# Patient Record
Sex: Female | Born: 1950 | Race: Black or African American | Hispanic: No | Smoking: Current every day smoker
Health system: Southern US, Community
[De-identification: ages and names within clinical notes are randomized; demographics above are authoritative.]

## PROBLEM LIST (undated history)

## (undated) DIAGNOSIS — F319 Bipolar disorder, unspecified: Secondary | ICD-10-CM

## (undated) DIAGNOSIS — I10 Essential (primary) hypertension: Secondary | ICD-10-CM

## (undated) DIAGNOSIS — M797 Fibromyalgia: Secondary | ICD-10-CM

## (undated) DIAGNOSIS — R918 Other nonspecific abnormal finding of lung field: Secondary | ICD-10-CM

## (undated) DIAGNOSIS — F419 Anxiety disorder, unspecified: Secondary | ICD-10-CM

## (undated) DIAGNOSIS — F1021 Alcohol dependence, in remission: Secondary | ICD-10-CM

## (undated) DIAGNOSIS — E785 Hyperlipidemia, unspecified: Secondary | ICD-10-CM

## (undated) DIAGNOSIS — I5032 Chronic diastolic (congestive) heart failure: Secondary | ICD-10-CM

## (undated) DIAGNOSIS — I209 Angina pectoris, unspecified: Secondary | ICD-10-CM

## (undated) DIAGNOSIS — F32A Depression, unspecified: Secondary | ICD-10-CM

## (undated) DIAGNOSIS — M1990 Unspecified osteoarthritis, unspecified site: Secondary | ICD-10-CM

## (undated) DIAGNOSIS — R0602 Shortness of breath: Secondary | ICD-10-CM

## (undated) DIAGNOSIS — F329 Major depressive disorder, single episode, unspecified: Secondary | ICD-10-CM

## (undated) HISTORY — DX: Major depressive disorder, single episode, unspecified: F32.9

## (undated) HISTORY — DX: Anxiety disorder, unspecified: F41.9

## (undated) HISTORY — DX: Essential (primary) hypertension: I10

## (undated) HISTORY — DX: Depression, unspecified: F32.A

## (undated) HISTORY — DX: Chronic diastolic (congestive) heart failure: I50.32

## (undated) HISTORY — DX: Bipolar disorder, unspecified: F31.9

## (undated) HISTORY — PX: LEFT OOPHORECTOMY: SHX1961

## (undated) HISTORY — DX: Other nonspecific abnormal finding of lung field: R91.8

## (undated) HISTORY — DX: Alcohol dependence, in remission: F10.21

## (undated) HISTORY — DX: Hyperlipidemia, unspecified: E78.5

## (undated) HISTORY — DX: Fibromyalgia: M79.7

## (undated) HISTORY — DX: Unspecified osteoarthritis, unspecified site: M19.90

## (undated) HISTORY — PX: EXPLORATORY LAPAROTOMY: SUR591

## (undated) HISTORY — PX: LEG SURGERY: SHX1003

---

## 2004-05-03 ENCOUNTER — Ambulatory Visit: Payer: Self-pay | Admitting: Psychiatry

## 2004-05-03 ENCOUNTER — Inpatient Hospital Stay (HOSPITAL_COMMUNITY): Admission: RE | Admit: 2004-05-03 | Discharge: 2004-05-09 | Payer: Self-pay | Admitting: Psychiatry

## 2004-06-07 ENCOUNTER — Encounter: Admission: RE | Admit: 2004-06-07 | Discharge: 2004-06-07 | Payer: Self-pay | Admitting: Internal Medicine

## 2004-07-24 ENCOUNTER — Encounter: Admission: RE | Admit: 2004-07-24 | Discharge: 2004-07-24 | Payer: Self-pay | Admitting: Internal Medicine

## 2004-07-28 ENCOUNTER — Emergency Department (HOSPITAL_COMMUNITY): Admission: EM | Admit: 2004-07-28 | Discharge: 2004-07-28 | Payer: Self-pay | Admitting: Emergency Medicine

## 2004-08-09 ENCOUNTER — Encounter (HOSPITAL_COMMUNITY): Admission: RE | Admit: 2004-08-09 | Discharge: 2004-11-07 | Payer: Self-pay | Admitting: Cardiology

## 2004-08-30 ENCOUNTER — Ambulatory Visit: Payer: Self-pay | Admitting: Internal Medicine

## 2004-09-03 ENCOUNTER — Encounter: Admission: RE | Admit: 2004-09-03 | Discharge: 2004-09-03 | Payer: Self-pay | Admitting: Internal Medicine

## 2004-09-13 ENCOUNTER — Encounter: Admission: RE | Admit: 2004-09-13 | Discharge: 2004-09-13 | Payer: Self-pay | Admitting: Internal Medicine

## 2004-09-24 ENCOUNTER — Ambulatory Visit (HOSPITAL_BASED_OUTPATIENT_CLINIC_OR_DEPARTMENT_OTHER): Admission: RE | Admit: 2004-09-24 | Discharge: 2004-09-24 | Payer: Self-pay | Admitting: Internal Medicine

## 2004-09-30 ENCOUNTER — Ambulatory Visit: Payer: Self-pay | Admitting: Internal Medicine

## 2004-10-11 ENCOUNTER — Ambulatory Visit: Payer: Self-pay | Admitting: Internal Medicine

## 2004-12-11 ENCOUNTER — Emergency Department (HOSPITAL_COMMUNITY): Admission: EM | Admit: 2004-12-11 | Discharge: 2004-12-11 | Payer: Self-pay | Admitting: Emergency Medicine

## 2004-12-21 ENCOUNTER — Encounter: Admission: RE | Admit: 2004-12-21 | Discharge: 2004-12-21 | Payer: Self-pay | Admitting: Internal Medicine

## 2005-02-25 HISTORY — PX: CEREBRAL ANEURYSM REPAIR: SHX164

## 2005-04-04 ENCOUNTER — Emergency Department (HOSPITAL_COMMUNITY): Admission: EM | Admit: 2005-04-04 | Discharge: 2005-04-04 | Payer: Self-pay | Admitting: Emergency Medicine

## 2005-04-30 ENCOUNTER — Encounter: Admission: RE | Admit: 2005-04-30 | Discharge: 2005-04-30 | Payer: Self-pay | Admitting: Internal Medicine

## 2005-05-03 ENCOUNTER — Ambulatory Visit (HOSPITAL_COMMUNITY): Admission: RE | Admit: 2005-05-03 | Discharge: 2005-05-03 | Payer: Self-pay | Admitting: Internal Medicine

## 2005-05-03 ENCOUNTER — Encounter (INDEPENDENT_AMBULATORY_CARE_PROVIDER_SITE_OTHER): Payer: Self-pay | Admitting: Specialist

## 2005-05-05 ENCOUNTER — Ambulatory Visit (HOSPITAL_COMMUNITY): Admission: RE | Admit: 2005-05-05 | Discharge: 2005-05-05 | Payer: Self-pay | Admitting: Interventional Radiology

## 2005-05-10 ENCOUNTER — Encounter: Payer: Self-pay | Admitting: Interventional Radiology

## 2005-07-11 ENCOUNTER — Ambulatory Visit (HOSPITAL_COMMUNITY): Admission: RE | Admit: 2005-07-11 | Discharge: 2005-07-12 | Payer: Self-pay | Admitting: Interventional Radiology

## 2005-07-18 ENCOUNTER — Encounter: Payer: Self-pay | Admitting: Interventional Radiology

## 2005-09-02 ENCOUNTER — Ambulatory Visit (HOSPITAL_COMMUNITY): Admission: RE | Admit: 2005-09-02 | Discharge: 2005-09-02 | Payer: Self-pay | Admitting: Neurosurgery

## 2005-10-11 ENCOUNTER — Ambulatory Visit (HOSPITAL_COMMUNITY): Admission: RE | Admit: 2005-10-11 | Discharge: 2005-10-11 | Payer: Self-pay | Admitting: Interventional Radiology

## 2005-10-23 ENCOUNTER — Encounter: Admission: RE | Admit: 2005-10-23 | Discharge: 2005-11-18 | Payer: Self-pay | Admitting: Orthopedic Surgery

## 2005-11-19 ENCOUNTER — Encounter: Admission: RE | Admit: 2005-11-19 | Discharge: 2005-12-15 | Payer: Self-pay | Admitting: Orthopedic Surgery

## 2005-12-04 ENCOUNTER — Encounter: Admission: RE | Admit: 2005-12-04 | Discharge: 2005-12-04 | Payer: Self-pay | Admitting: Orthopedic Surgery

## 2005-12-16 ENCOUNTER — Encounter: Admission: RE | Admit: 2005-12-16 | Discharge: 2006-01-13 | Payer: Self-pay | Admitting: Orthopedic Surgery

## 2006-01-14 ENCOUNTER — Encounter (INDEPENDENT_AMBULATORY_CARE_PROVIDER_SITE_OTHER): Payer: Self-pay | Admitting: *Deleted

## 2006-01-14 ENCOUNTER — Encounter: Admission: RE | Admit: 2006-01-14 | Discharge: 2006-01-14 | Payer: Self-pay | Admitting: Internal Medicine

## 2006-06-05 ENCOUNTER — Ambulatory Visit (HOSPITAL_COMMUNITY): Admission: RE | Admit: 2006-06-05 | Discharge: 2006-06-05 | Payer: Self-pay | Admitting: Orthopedic Surgery

## 2006-06-26 ENCOUNTER — Encounter: Admission: RE | Admit: 2006-06-26 | Discharge: 2006-09-24 | Payer: Self-pay | Admitting: Orthopedic Surgery

## 2006-09-05 ENCOUNTER — Encounter: Admission: RE | Admit: 2006-09-05 | Discharge: 2006-09-05 | Payer: Self-pay | Admitting: Internal Medicine

## 2006-10-03 IMAGING — CR DG CHEST 2V
2 series · 2 of 2 positions shown · non-contrast
Comparison: none

CLINICAL DATA: Fever, cough.
 CHEST - 2 VIEWS:   
 Two views of the chest are compared to a chest x-ray of 06/07/04.  Prominent perihilar markings are again noted with peribronchial thickening consistent with bronchitis.  No pneumonia is seen.  The heart is within normal limits in size.  No bony abnormality is seen.

[w chest pa]
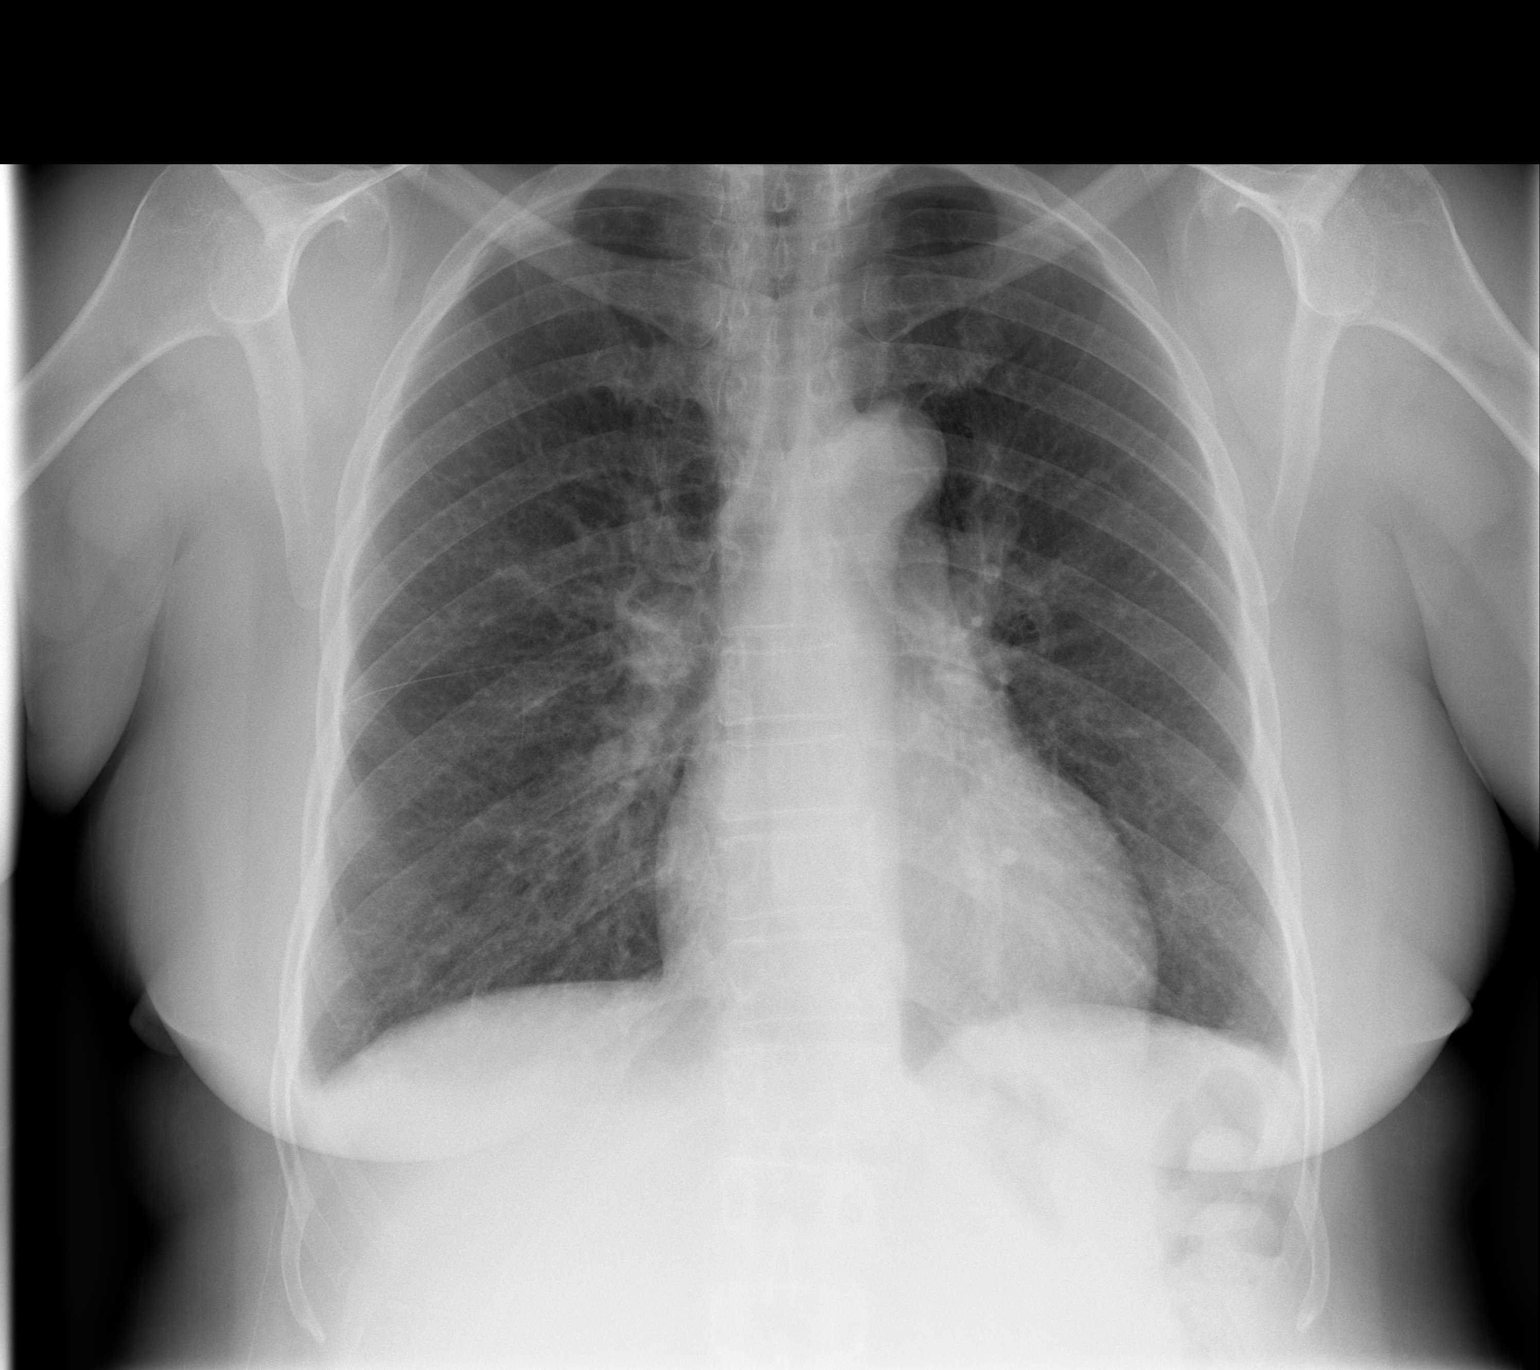

[w chest lat]
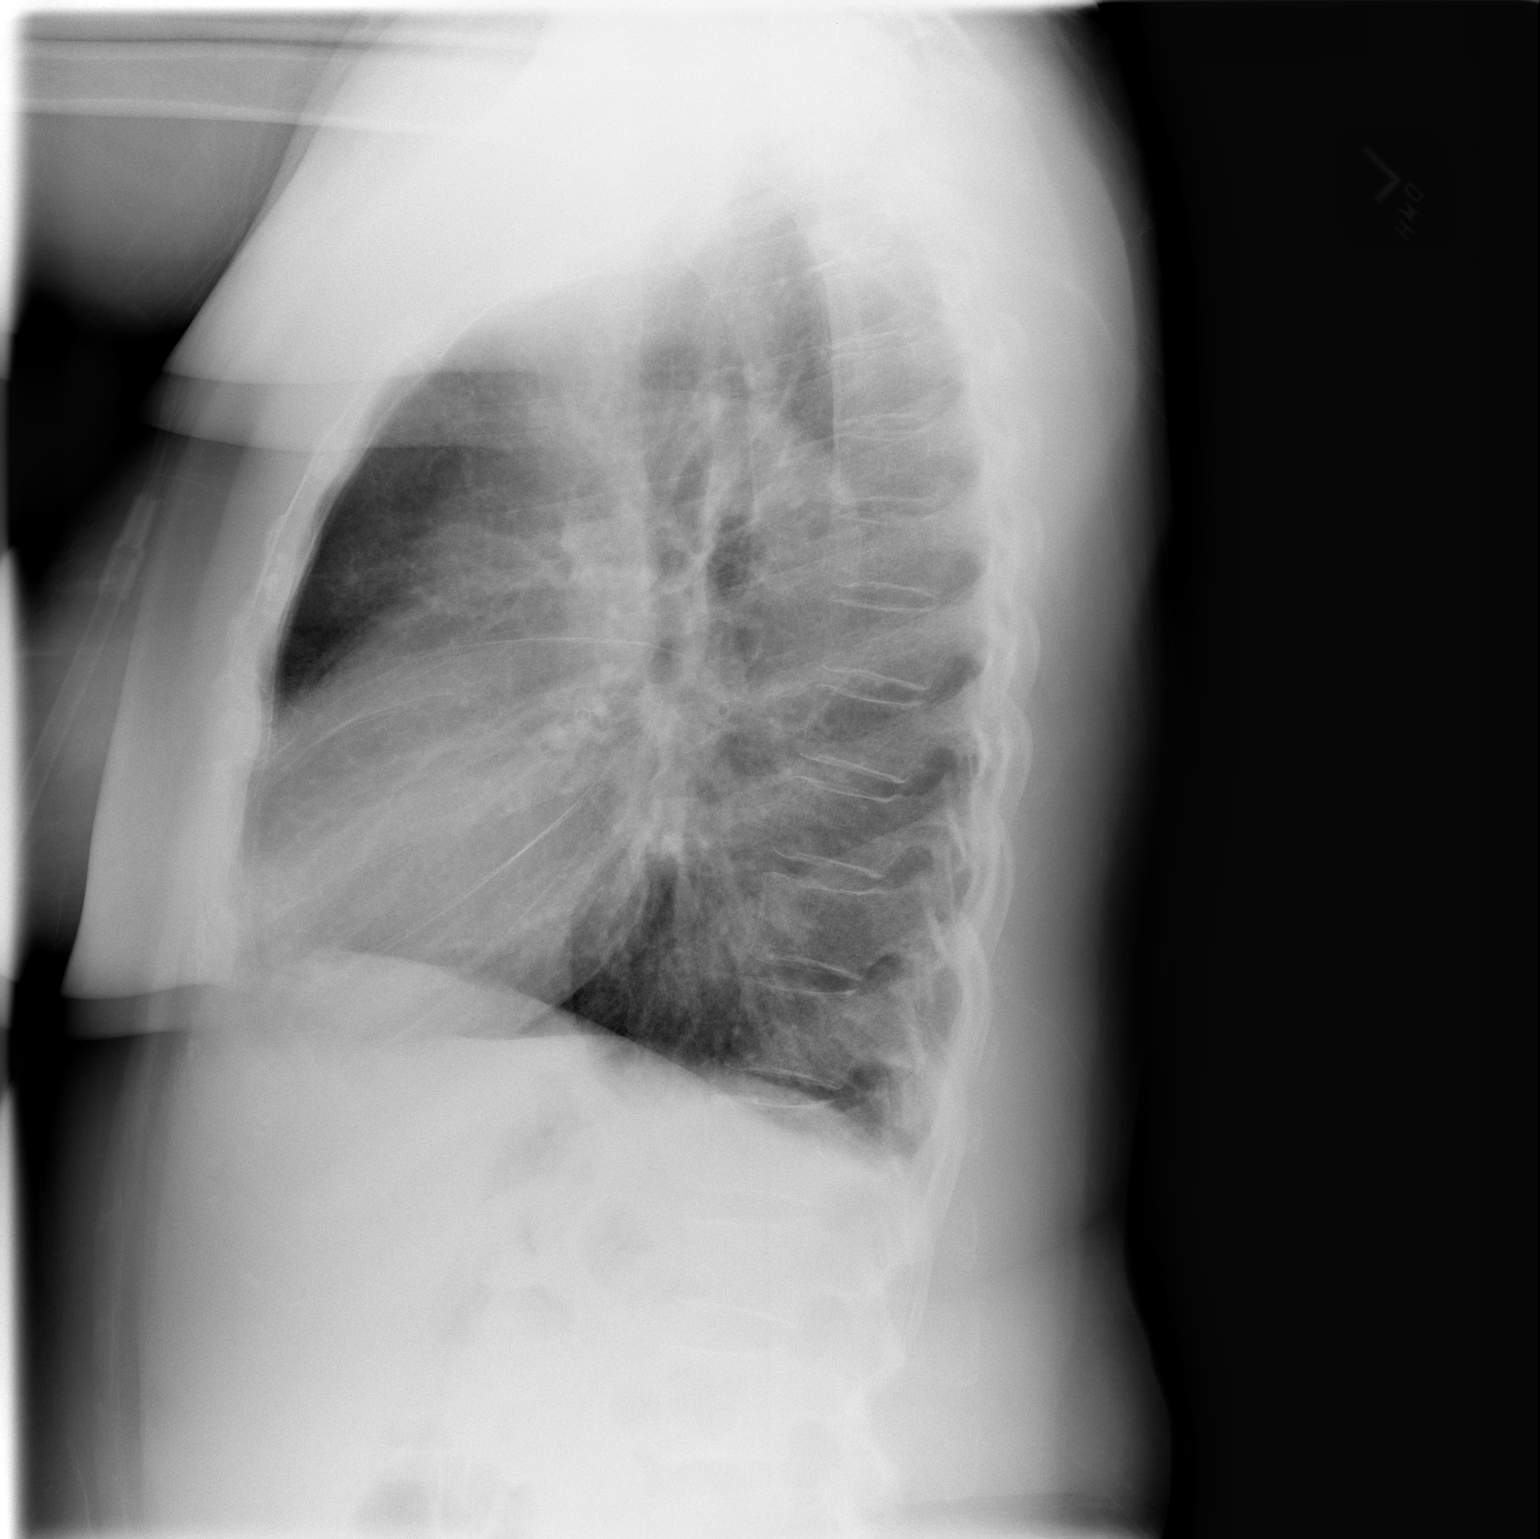

[2 of 2 positions shown; findings below may reference images not displayed]

IMPRESSION: Bronchitis.  No definite pneumonia.

## 2007-03-09 ENCOUNTER — Emergency Department (HOSPITAL_COMMUNITY): Admission: EM | Admit: 2007-03-09 | Discharge: 2007-03-09 | Payer: Self-pay | Admitting: Emergency Medicine

## 2007-03-30 ENCOUNTER — Encounter: Admission: RE | Admit: 2007-03-30 | Discharge: 2007-03-30 | Payer: Self-pay | Admitting: Internal Medicine

## 2007-07-24 ENCOUNTER — Ambulatory Visit (HOSPITAL_COMMUNITY): Admission: RE | Admit: 2007-07-24 | Discharge: 2007-07-24 | Payer: Self-pay | Admitting: Interventional Radiology

## 2007-07-28 ENCOUNTER — Encounter (INDEPENDENT_AMBULATORY_CARE_PROVIDER_SITE_OTHER): Payer: Self-pay | Admitting: Internal Medicine

## 2007-07-28 ENCOUNTER — Inpatient Hospital Stay (HOSPITAL_COMMUNITY): Admission: EM | Admit: 2007-07-28 | Discharge: 2007-07-29 | Payer: Self-pay | Admitting: Emergency Medicine

## 2008-03-28 ENCOUNTER — Ambulatory Visit: Payer: Self-pay | Admitting: Internal Medicine

## 2008-04-20 ENCOUNTER — Ambulatory Visit: Payer: Self-pay | Admitting: Internal Medicine

## 2008-07-18 ENCOUNTER — Ambulatory Visit (HOSPITAL_COMMUNITY): Admission: RE | Admit: 2008-07-18 | Discharge: 2008-07-18 | Payer: Self-pay | Admitting: Interventional Radiology

## 2008-08-10 ENCOUNTER — Encounter: Payer: Self-pay | Admitting: Interventional Radiology

## 2009-01-24 ENCOUNTER — Emergency Department (HOSPITAL_COMMUNITY): Admission: EM | Admit: 2009-01-24 | Discharge: 2009-01-24 | Payer: Self-pay | Admitting: Emergency Medicine

## 2009-04-27 ENCOUNTER — Telehealth: Payer: Self-pay | Admitting: Internal Medicine

## 2009-05-01 ENCOUNTER — Ambulatory Visit: Payer: Self-pay | Admitting: Internal Medicine

## 2009-05-01 DIAGNOSIS — I10 Essential (primary) hypertension: Secondary | ICD-10-CM | POA: Insufficient documentation

## 2009-05-01 DIAGNOSIS — F329 Major depressive disorder, single episode, unspecified: Secondary | ICD-10-CM

## 2009-05-01 DIAGNOSIS — F411 Generalized anxiety disorder: Secondary | ICD-10-CM | POA: Insufficient documentation

## 2009-05-01 DIAGNOSIS — E785 Hyperlipidemia, unspecified: Secondary | ICD-10-CM | POA: Insufficient documentation

## 2009-05-01 DIAGNOSIS — K5730 Diverticulosis of large intestine without perforation or abscess without bleeding: Secondary | ICD-10-CM | POA: Insufficient documentation

## 2009-05-01 DIAGNOSIS — R1032 Left lower quadrant pain: Secondary | ICD-10-CM | POA: Insufficient documentation

## 2009-05-01 DIAGNOSIS — R1031 Right lower quadrant pain: Secondary | ICD-10-CM

## 2009-05-02 ENCOUNTER — Encounter: Payer: Self-pay | Admitting: Internal Medicine

## 2009-05-02 ENCOUNTER — Ambulatory Visit: Payer: Self-pay | Admitting: Cardiovascular Disease

## 2009-05-03 ENCOUNTER — Ambulatory Visit: Payer: Self-pay | Admitting: Physician Assistant

## 2009-05-03 LAB — CONVERTED CEMR LAB
Bilirubin Urine: NEGATIVE
Hemoglobin, Urine: NEGATIVE
Ketones, ur: NEGATIVE mg/dL
pH: 7 (ref 5.0–8.0)

## 2009-05-10 LAB — CONVERTED CEMR LAB
BUN: 13 mg/dL (ref 6–23)
Basophils Relative: 0.2 % (ref 0.0–3.0)
Chloride: 100 meq/L (ref 96–112)
Creatinine, Ser: 1.6 mg/dL — ABNORMAL HIGH (ref 0.4–1.2)
Eosinophils Relative: 5.9 % — ABNORMAL HIGH (ref 0.0–5.0)
GFR calc non Af Amer: 42.26 mL/min (ref >60)
Hemoglobin, Urine: NEGATIVE
Lymphocytes Relative: 46.1 % — ABNORMAL HIGH (ref 12.0–46.0)
Monocytes Relative: 8.2 % (ref 3.0–12.0)
Neutrophils Relative %: 39.6 % — ABNORMAL LOW (ref 43.0–77.0)
Nitrite: NEGATIVE
Platelets: 382 10*3/uL (ref 150.0–400.0)
RBC: 3.79 M/uL — ABNORMAL LOW (ref 3.87–5.11)
Specific Gravity, Urine: 1.01 (ref 1.000–1.030)
Total Protein, Urine: 30 mg/dL
Urine Glucose: 100 mg/dL
WBC: 10.7 10*3/uL — ABNORMAL HIGH (ref 4.5–10.5)
pH: 7 (ref 5.0–8.0)

## 2009-05-19 DIAGNOSIS — J441 Chronic obstructive pulmonary disease with (acute) exacerbation: Secondary | ICD-10-CM

## 2009-05-19 DIAGNOSIS — IMO0001 Reserved for inherently not codable concepts without codable children: Secondary | ICD-10-CM | POA: Insufficient documentation

## 2009-05-19 DIAGNOSIS — M129 Arthropathy, unspecified: Secondary | ICD-10-CM | POA: Insufficient documentation

## 2009-05-24 ENCOUNTER — Encounter: Payer: Self-pay | Admitting: Internal Medicine

## 2009-05-25 ENCOUNTER — Ambulatory Visit: Payer: Self-pay | Admitting: Internal Medicine

## 2009-05-25 DIAGNOSIS — R110 Nausea: Secondary | ICD-10-CM

## 2009-06-04 IMAGING — CR DG KNEE COMPLETE 4+V*L*
4 series · 4 of 4 positions shown · non-contrast
Comparison: 03/09/2007

LEFT KNEE - 4  VIEW:

CLINICAL DATA: Knee pain

[view not recorded (1 of 4)]
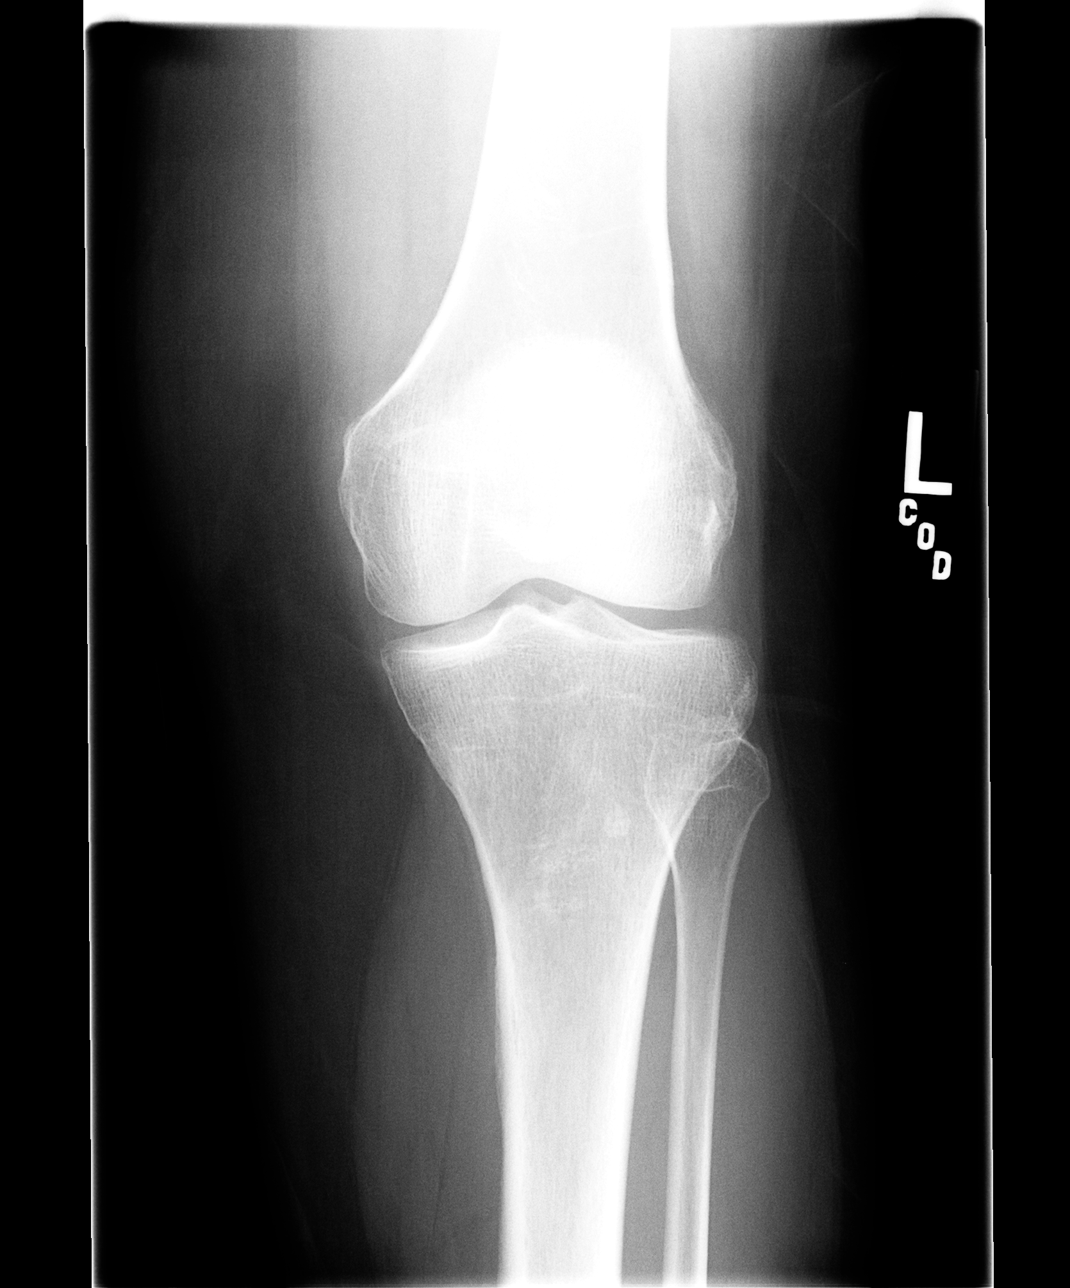

[view not recorded (2 of 4)]
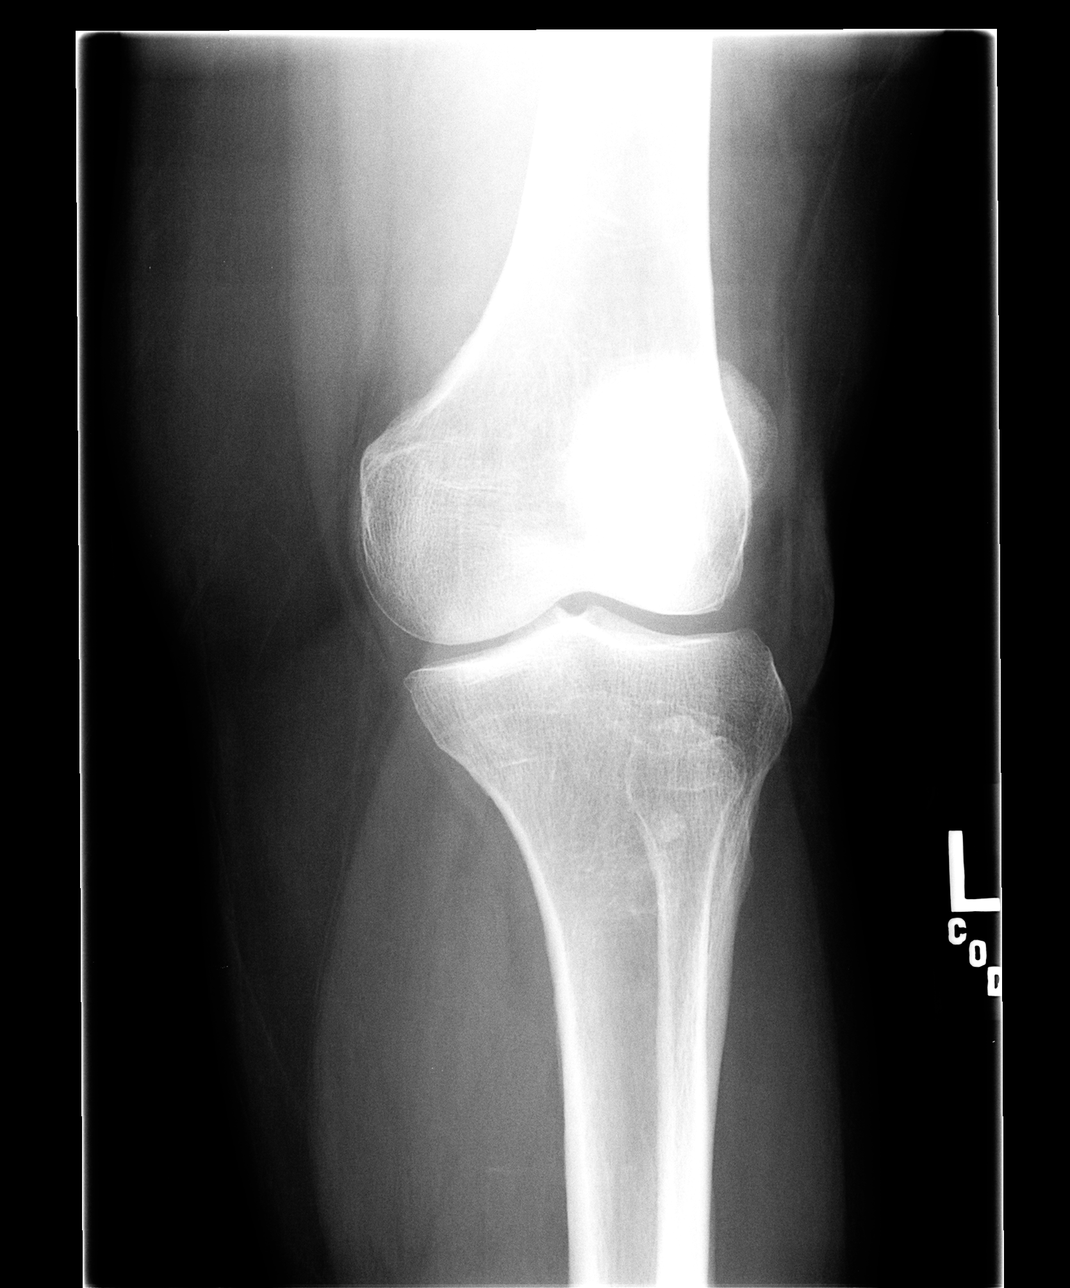

[view not recorded (3 of 4)]
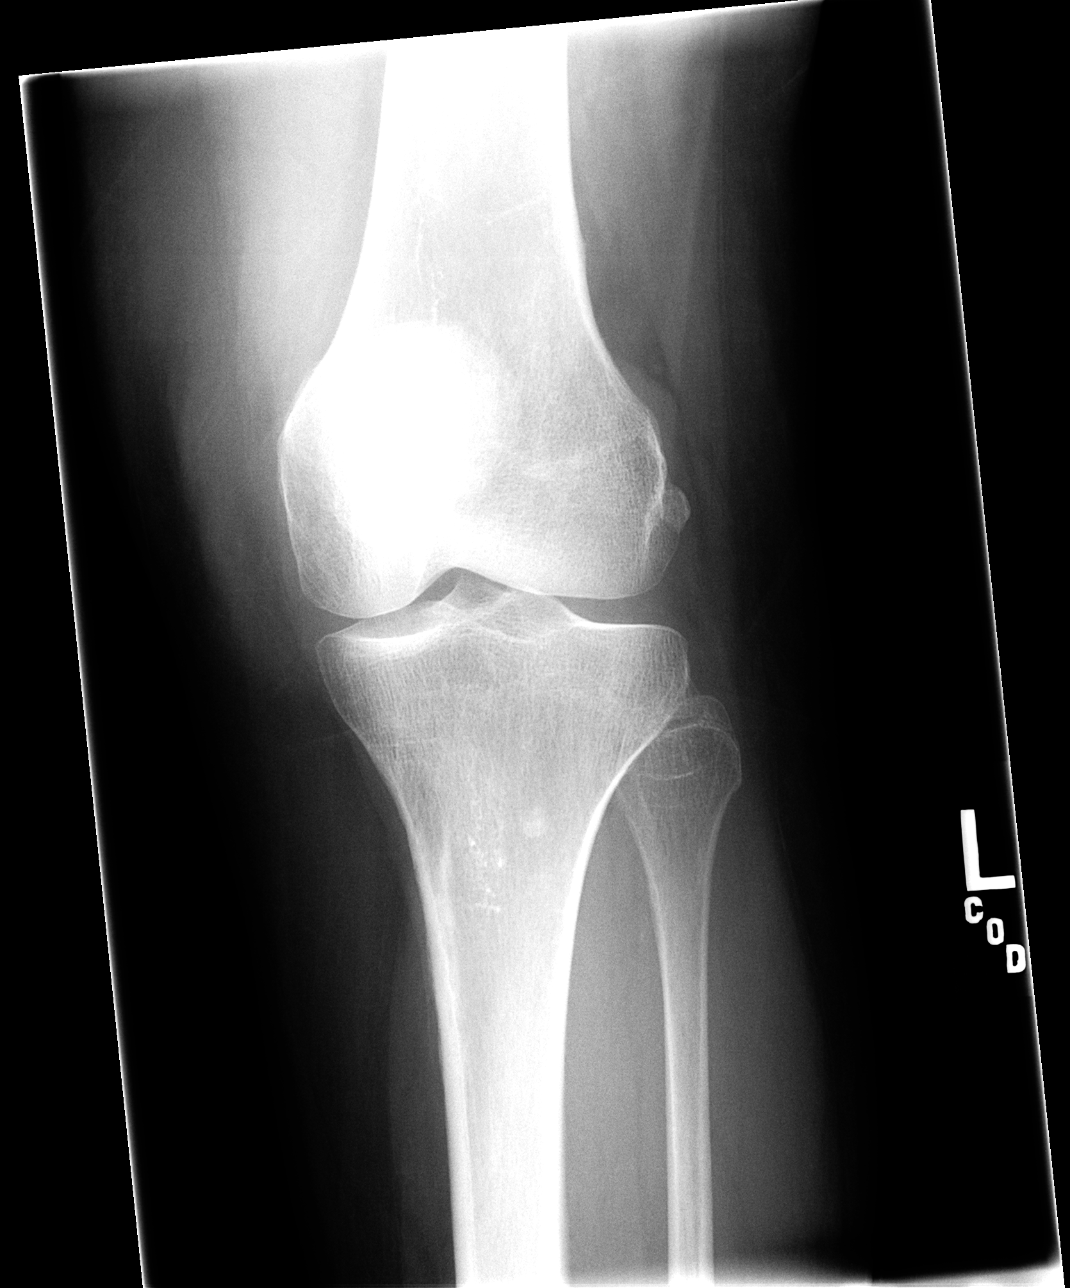

[view not recorded (4 of 4)]
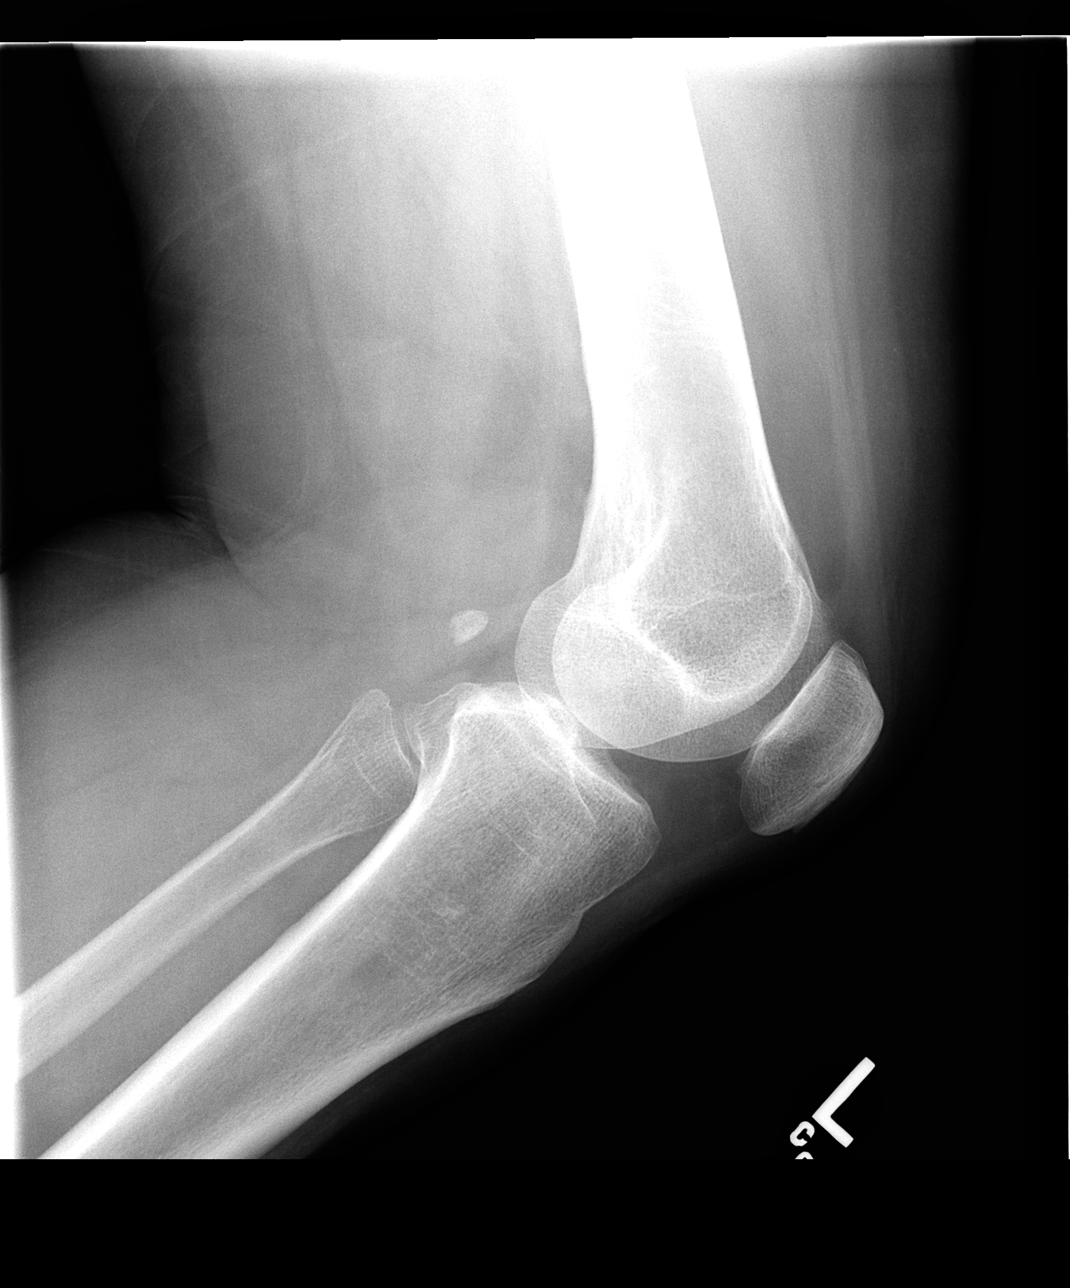

[4 of 4 positions shown; findings below may reference images not displayed]

FINDINGS: No acute fracture or dislocation.  No evidence for joint effusion. 
Overlying soft tissue are unremarkable.
IMPRESSION: No acute bony abnormality.

## 2009-06-05 ENCOUNTER — Ambulatory Visit: Payer: Self-pay | Admitting: Internal Medicine

## 2009-06-05 ENCOUNTER — Ambulatory Visit (HOSPITAL_COMMUNITY): Admission: RE | Admit: 2009-06-05 | Discharge: 2009-06-05 | Payer: Self-pay | Admitting: Internal Medicine

## 2009-06-06 ENCOUNTER — Encounter: Payer: Self-pay | Admitting: Internal Medicine

## 2009-06-07 ENCOUNTER — Telehealth: Payer: Self-pay | Admitting: Internal Medicine

## 2009-07-27 ENCOUNTER — Ambulatory Visit (HOSPITAL_COMMUNITY): Admission: RE | Admit: 2009-07-27 | Discharge: 2009-07-27 | Payer: Self-pay | Admitting: Interventional Radiology

## 2009-11-28 ENCOUNTER — Ambulatory Visit (HOSPITAL_COMMUNITY): Admission: RE | Admit: 2009-11-28 | Discharge: 2009-11-28 | Payer: Self-pay | Admitting: Internal Medicine

## 2009-11-30 ENCOUNTER — Encounter: Admission: RE | Admit: 2009-11-30 | Discharge: 2009-11-30 | Payer: Self-pay | Admitting: Internal Medicine

## 2010-03-16 ENCOUNTER — Emergency Department (HOSPITAL_COMMUNITY)
Admission: EM | Admit: 2010-03-16 | Discharge: 2010-03-16 | Payer: Self-pay | Source: Home / Self Care | Admitting: Emergency Medicine

## 2010-03-18 ENCOUNTER — Encounter: Payer: Self-pay | Admitting: Internal Medicine

## 2010-03-18 ENCOUNTER — Encounter: Payer: Self-pay | Admitting: Interventional Radiology

## 2010-03-19 LAB — DIFFERENTIAL
Lymphocytes Relative: 35 % (ref 12–46)
Lymphs Abs: 3.5 10*3/uL (ref 0.7–4.0)
Monocytes Relative: 9 % (ref 3–12)
Neutro Abs: 5.1 10*3/uL (ref 1.7–7.7)
Neutrophils Relative %: 51 % (ref 43–77)

## 2010-03-19 LAB — COMPREHENSIVE METABOLIC PANEL
ALT: 17 U/L (ref 0–35)
AST: 20 U/L (ref 0–37)
CO2: 25 mEq/L (ref 19–32)
Calcium: 9.5 mg/dL (ref 8.4–10.5)
Chloride: 106 mEq/L (ref 96–112)
Creatinine, Ser: 0.84 mg/dL (ref 0.4–1.2)
GFR calc non Af Amer: >60 mL/min (ref >60)
Glucose, Bld: 74 mg/dL (ref 70–99)
Sodium: 141 mEq/L (ref 135–145)
Total Bilirubin: 0.6 mg/dL (ref 0.3–1.2)

## 2010-03-19 LAB — CBC
HCT: 40.8 % (ref 36.0–46.0)
Hemoglobin: 14.1 g/dL (ref 12.0–15.0)
MCH: 29.6 pg (ref 26.0–34.0)
MCHC: 34.6 g/dL (ref 30.0–36.0)
MCV: 85.5 fL (ref 78.0–100.0)
Platelets: 403 10*3/uL — ABNORMAL HIGH (ref 150–400)
RBC: 4.77 MIL/uL (ref 3.87–5.11)
RDW: 14.3 % (ref 11.5–15.5)
WBC: 10.1 10*3/uL (ref 4.0–10.5)

## 2010-03-19 LAB — POCT CARDIAC MARKERS

## 2010-03-27 NOTE — Procedures (Signed)
Summary: Upper Endoscopy  Patient: [name] Note: All result statuses are Final unless otherwise noted.  Tests: (1) Upper Endoscopy (EGD)   EGD Upper Endoscopy       DONE     [facility]     [address] [place], [place]  [address]           ENDOSCOPY PROCEDURE REPORT           PATIENT:  Wanda, Brown  MR#:  [id number]     BIRTHDATE:  [birth year], 59 yrs. old  GENDER:  female           ENDOSCOPIST:  Hedwig Morton. Juanda Chance, MD     Referred by:  Della Goo, M.D.           PROCEDURE DATE:  06/05/2009     PROCEDURE:  EGD with biopsy     ASA CLASS:  Class II     INDICATIONS:  abdominal pain, dysphagia, nausea, anemia normal     colonoscopy 03/2008           MEDICATIONS:   Versed 2 mg, Fentanyl 25 mcg     TOPICAL ANESTHETIC:  Cetacaine Spray           DESCRIPTION OF PROCEDURE:   After the risks benefits and     alternatives of the procedure were thoroughly explained, informed     consent was obtained.  The  endoscope was introduced through the     mouth and advanced to the second portion of the duodenum, without     limitations.  The instrument was slowly withdrawn as the mucosa     was fully examined.     <<PROCEDUREIMAGES>>           Normal GE junction was noted (see image1, image2, image8, image9,     and image10).  Nodular mucosa was found in the body and the antrum     of the stomach. Multiple biopsies were obtained and sent to     pathology (see image3, image5, image6, and image7).  The duodenal     bulb was normal in appearance, as was the postbulbar duodenum. r/o     sprue Multiple biopsies were obtained and sent to pathology (see     image4).    Retroflexed views revealed no abnormalities.    The     scope was then withdrawn from the patient and the procedure     completed.           COMPLICATIONS:  None           ENDOSCOPIC IMPRESSION:     1) Normal GE junction     2) Nodular mucosa in the body and the antrum of the stomach     3) Normal  duodenum     nothing to account for anemia or abdominal pain     RECOMMENDATIONS:     1) Await biopsy results     continue Prelosec 20 mg po qd     question of whther Ibuprofen (Advil) are contributing to pt's     abd. pain, would minimize use of NSAID's,           REPEAT EXAM:  In 0 year(s) for.           ______________________________     Hedwig Morton. Juanda Chance, MD           CC:           n.  eSIGNED:   Hedwig Morton. Ezrah Panning at 06/05/2009 09:01 AM           Wayna Chalet, [id number]  Note: An exclamation mark (!) indicates a result that was not dispersed into the flowsheet. Document Creation Date: 06/05/2009 9:02 AM _______________________________________________________________________  (1) Order result status: Final Collection or observation date-time: 06/05/2009 08:51 Requested date-time:  Receipt date-time:  Reported date-time:  Referring Physician:   Ordering Physician: Lina Sar [phone]) Specimen Source:  Source: Launa Grill Order Number: [id number] Lab site:

## 2010-03-27 NOTE — Letter (Signed)
Summary: Patient [facility] Biopsy Results  [facility]  [address] [place], [place] [address]   Phone: [phone]  Fax: [phone]        June 06, 2009 MRN: [id number]    MEYLI BOICE [address] [place], [place]  [address]    Dear Ms. Gregson,  I am pleased to inform you that the biopsies taken during your recent endoscopic examination did not show any evidence of cancer upon pathologic examination.The tissue from Your stomach shows H.Pylori infection.   Additional information/recommendations:  __No further action is needed at this time.  Please follow-up with      your primary care physician for your other healthcare needs.  _x_ Please call [phone] to talk to Stanton Kidney to send You prescriptions for H.Pylori  __ Continue with the treatment plan as outlined on the day of your      exam.  __ You should have a repeat endoscopic examination for this problem              in _ months/years.   Please call us if you are having persistent problems or have questions about your condition that have not been fully answered at this time.  Sincerely,  Hart Carwin MD  This letter has been electronically signed by your physician.  Appended Document: Patient Notice-Endo Biopsy Results Will give to pt when she comes for her PYLERA.

## 2010-03-27 NOTE — Progress Notes (Signed)
Summary: Positive H-Pylori  ---- Converted from flag ---- ---- 06/06/2009 11:02 PM, Hart Carwin MD wrote: Izell [place], could You, please send Pylera packege for this pt? who has positive CLO test. Thanx ------------------------------  Phone Note Outgoing Call   Call placed by: Laureen Ochs LPN,  June 07, 2009 10:29 AM Call placed to:  Pt. caregiver/Arlene: [phone] Summary of Call: Message left for patient to callback.  Initial call taken by: Laureen Ochs LPN,  June 07, 2009 10:29 AM  Follow-up for Phone Call        Message left for patient's caregiver,Arlene, to callback.Laureen Ochs LPN  June 07, 2009 2:21 PM  Above MD orders reviewed with Cira Rue, she will come by tomorrow to get meds/instructions. Arlene instructed to call back as needed.  Follow-up by: Laureen Ochs LPN,  June 07, 2009 3:05 PM

## 2010-03-27 NOTE — Progress Notes (Signed)
Summary: Triage  Phone Note From Other Clinic Call back at [phone]   Caller: Selena Batten, office manager Call For: Dr. Juanda Chance Reason for Call: Schedule Patient Appt Summary of Call: nothing available for NP3... Dr. Lovell Sheehan would like pt seen for abd pain Initial call taken by: Vallarie Mare,  April 27, 2009 4:08 PM  Follow-up for Phone Call        Dr.Jenkins office doesn't open today until 12noon.  I have spoken with pt., she c/o intermittent lower abd. pain for 1 week. Denies constipation, diarrhea, blood, black stools, fever, n/v.    1) See Amy Esterwood PAC on 05-01-09 at 10am 2) Soft,bland diet. No spicy,greasy,fried foods.  3) tylenol/Ibuprofen as needed 4) Heating pad to abdomen as needed. 5) If symptoms become worse call back immediately or go to ER.  Follow-up by: Laureen Ochs LPN,  April 28, 2009 8:34 AM     Appended Document: Triage Selena Batten w/Dr.H.Jenkins is aware of pt. appt. and she will fax records today.

## 2010-03-27 NOTE — Miscellaneous (Signed)
Summary: CT ABD/Pelvis  Clinical Lists Changes  Orders: Added new Referral order of CT Abdomen/Pelvis w/o Contrast (CT ABD/Pel w/o con) - Signed 

## 2010-03-27 NOTE — Letter (Signed)
Summary: Report of Health Services/[facility]  Report of Health Services/[facility]   Imported By: Sherian Rein 05/29/2009 14:56:35  _____________________________________________________________________  External Attachment:    Type:   Image     Comment:   External Document
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
## 2010-03-27 NOTE — Letter (Signed)
Summary: EGD Instructions  [facility]  [address] [place], [place] [address]   Phone: [phone]  Fax: [phone]       Wanda Brown    1951/02/14    MRN: [id number]       Procedure Day /Date: 06/05/09 Monday     Arrival Time: 7:45 am     Procedure Time: 8:45 am     Location of Procedure:                    _ x_ [facility] ( Outpatient Registration)    PREPARATION FOR ENDOSCOPY   On 06/05/09 THE DAY OF THE PROCEDURE:  1.   No solid foods, milk or milk products are allowed after midnight the night before your procedure.  2.   Do not drink anything colored red or purple.  Avoid juices with pulp.  No orange juice.  3.  You may drink clear liquids until 5:45 am, which is 4 hours before your procedure.                                                                                                CLEAR LIQUIDS INCLUDE: Water Jello Ice Popsicles Tea (sugar ok, no milk/cream) Powdered fruit flavored drinks Coffee (sugar ok, no milk/cream) Gatorade Juice: apple, white grape, white cranberry  Lemonade Clear bullion, consomm, broth Carbonated beverages (any kind) Strained chicken noodle soup Hard Candy   MEDICATION INSTRUCTIONS  Unless otherwise instructed, you should take regular prescription medications with a small sip of water as early as possible the morning of your procedure.                 OTHER INSTRUCTIONS  You will need a responsible adult at least 61 years of age to accompany you and drive you home.   This person must remain in the waiting room during your procedure.  Wear loose fitting clothing that is easily removed.  Leave jewelry and other valuables at home.  However, you may wish to bring a book to read or an iPod/MP3 player to listen to music as you wait for your procedure to start.  Remove all body piercing jewelry and leave at home.  Total time from sign-in until discharge is approximately 2-3  hours.  You should go home directly after your procedure and rest.  You can resume normal activities the day after your procedure.  The day of your procedure you should not:   Drive   Make legal decisions   Operate machinery   Drink alcohol   Return to work  You will receive specific instructions about eating, activities and medications before you leave.    The above instructions have been reviewed and explained to me by  Hortense Ramal CMA Duncan Dull)  May 25, 2009 9:52 AM _    I fully understand and can verbalize these instructions _____________________________ Date _3/30/11

## 2010-03-27 NOTE — Procedures (Signed)
Summary: Instruction for procedure/[facility] (out pt)  Instruction for procedure/[facility] (out pt)   Imported By: Sherian Rein 06/05/2009 12:07:59  _____________________________________________________________________  External Attachment:    Type:   Image     Comment:   External Document

## 2010-04-14 ENCOUNTER — Emergency Department (HOSPITAL_COMMUNITY)
Admission: EM | Admit: 2010-04-14 | Discharge: 2010-04-14 | Disposition: A | Discharge status: Home / Self Care | Payer: PRIVATE HEALTH INSURANCE | Attending: Emergency Medicine | Admitting: Emergency Medicine

## 2010-04-14 DIAGNOSIS — G40909 Epilepsy, unspecified, not intractable, without status epilepticus: Secondary | ICD-10-CM | POA: Insufficient documentation

## 2010-04-14 DIAGNOSIS — F209 Schizophrenia, unspecified: Secondary | ICD-10-CM | POA: Insufficient documentation

## 2010-04-14 DIAGNOSIS — I10 Essential (primary) hypertension: Secondary | ICD-10-CM | POA: Insufficient documentation

## 2010-04-14 DIAGNOSIS — M545 Low back pain, unspecified: Secondary | ICD-10-CM | POA: Insufficient documentation

## 2010-04-14 DIAGNOSIS — K219 Gastro-esophageal reflux disease without esophagitis: Secondary | ICD-10-CM | POA: Insufficient documentation

## 2010-04-14 DIAGNOSIS — J4489 Other specified chronic obstructive pulmonary disease: Secondary | ICD-10-CM | POA: Insufficient documentation

## 2010-04-14 DIAGNOSIS — G20A1 Parkinson's disease without dyskinesia, without mention of fluctuations: Secondary | ICD-10-CM | POA: Insufficient documentation

## 2010-04-14 DIAGNOSIS — F319 Bipolar disorder, unspecified: Secondary | ICD-10-CM | POA: Insufficient documentation

## 2010-04-14 DIAGNOSIS — J449 Chronic obstructive pulmonary disease, unspecified: Secondary | ICD-10-CM | POA: Insufficient documentation

## 2010-04-14 DIAGNOSIS — G20 Parkinson's disease: Secondary | ICD-10-CM | POA: Insufficient documentation

## 2010-04-14 LAB — URINALYSIS, ROUTINE W REFLEX MICROSCOPIC
Nitrite: NEGATIVE
Specific Gravity, Urine: 1.017 (ref 1.005–1.030)
Urobilinogen, UA: 1 mg/dL (ref 0.0–1.0)
pH: 6.5 (ref 5.0–8.0)

## 2010-05-14 LAB — BUN: BUN: 10 mg/dL (ref 6–23)

## 2010-05-14 LAB — CREATININE, SERUM: GFR calc Af Amer: 47 mL/min — ABNORMAL LOW (ref >60)

## 2010-07-10 NOTE — H&P (Signed)
NAME:  Wanda Brown, Wanda Brown              ACCOUNT NO.:  [id number]   MEDICAL RECORD NO.:  [id number]          PATIENT TYPE:  INP   LOCATION:  0102                         FACILITY[facility]  [facility]   PHYSICIAN:  Eduard Clos, MDDATE OF BIRTH:  [birth year]   DATE OF ADMISSION:  07/27/2007  DATE OF DISCHARGE:                              HISTORY & PHYSICAL   CHIEF COMPLAINT:  Chest pain.   HISTORY OF PRESENT ILLNESS:  A 60 year old female with known history of  hypertension, seizure disorder, history of  aneurysm status post  coiling, depression, bipolar disorder, tardive dyskinesia, obstructive  sleep apnea, ongoing tobacco abuse presented to the ER because the  patient was having chest pain.  The patient states that she was feeling  well until around 1 p.m.  She started developing some chest pain which  was retrosternal radiating to her left side of the neck and the left  arm.  She was actually smoking sitting on a chair at that time.  She  denies any exertional symptoms.  She had some nausea.  Denies any  shortness of breath, palpitations, diaphoresis, dizziness, or loss of  consciousness, power weakness of limbs.  The patient stated the chest  pain lasted until she came to the ER where she was given some  nitroglycerin and she had relief.  Subsequently which she also had chest  pain after that a couple of times which lasted only a few seconds.  The  chest pain is more of a stabbing nature, not related to exertion,  retrosternal radiating to her neck and the left arm.   PAST MEDICAL HISTORY:  Seizure disorder, hypertension, history of  intracaval aneurysm status post coiling, obstructive sleep apnea,  ongoing tobacco abuse, seizure disorder, bipolar disorder, tardive  dyskinesia.   PAST SURGICAL HISTORY:  Left rotator cuff surgery and intracranialal  aneurysm coiling.   MEDICATIONS:  Prior to admission:  1. The patient was recently stopped from Plavix 3 days ago.  2. The patient is on  atenolol 50 mg p.o. daily.  3. Wellbutrin 100 mg p.o. b.i.d.  4. Albuterol as needed.  5. Advair Diskus 100/50 one puff b.i.d.  6. Depakote ER 250 mg in the a.m. and 500 mg in the p.m.  7. Paxil 50 mg p.o. at bedtime.  8. Detrol LA 4 mg p.o. daily.  9. Maxzide 37.5/25 mg p.o. daily.  10.Atarax 50 mg p.o. at bedtime.  11.Flonase nasal spray.  12.Xanax 500 mg p.o. q.12 hours p.r.n.  13.Flexeril 10 mg p.o. at bedtime.  14.Risperdal 1 mg p.o. at bedtime.  15.K-Dur 20 mEq p.o. daily.  16.CPAP.  17.Multivitamin one tablet p.o. daily.   SOCIAL HISTORY:  The patient lives in nursing home.  Her family lives in  [place] near [place].  The patient still smokes cigarettes and has  been strongly advised to quit smoking.  Minimal history of drinking  alcohol, she states that she has been quit for a long time now.  Denies  any drug abuse.   FAMILY HISTORY:  Nothing contributory.   REVIEW OF SYSTEMS:  As present in the history of present  illness.  Nothing of significance.   PHYSICAL EXAMINATION:  GENERAL:  The patient examined at bedside not in  acute distress.  Denies any chest pain now.  VITAL SIGNS:  Blood pressure 122/64, pulse 84 per minute, temperature  97.3, respirations 18 per minute, O2 saturations 98%.  HEENT:  Anicteric.  No pallor.  CHEST:  Bilateral air entry present.  No rhonchi.  No crepitation.  HEART:  S1 and S2 heard.  ABDOMEN:  Soft and nontender.  Bowel sounds heard.  No guarding and no  rigidity.  NEUROLOGY:  Alert, awake, oriented to time, place, and person.  Moves  upper and lower extremities 5/5.  Peripheral pulses felt.  No edema.   LABORATORY DATA:  Chest x-ray shows increasing interstitial prominence.  There is some mild edema.  No consolidation.  Though clinically the  patient does not have any CHF at this moment.   EKG; normal sinus rhythm with no acute ST wave changes.   CBC; WBC is 13.1, hemoglobin 11.9, hematocrit 35, and platelets 316,  neutrophils  35%, lymphocytes 48%.  PT/INR 12.7 and 0.9.  CMP; sodium  138, potassium 3.4, chloride 103, glucose 88, BUN 5, creatinine 0.9.  Troponin less than 0.05.  CK-MB 1.1.   ASSESSMENT:  1. Chest pain, rule out acute coronary syndrome.  2. Hypertension.  3. Seizure disorder.  4. History of intracranial aneurysmstatus post coiling.  5. Depression with history of bipolar disorder.  6. Tardive dyskinesia.  7. Obstructive sleep apnea.  8. Ongoing tobacco abuse.  9. History of General Anxiety  disorder.   PLAN:  Admit the patient to Team H to telemetry.  Follow up cardiac  enzymes.  Continue home medication.  Advise the patient strongly to quit  smoking.  We will also obtain a two-dimensional echocardiogram.  Further  recommendations as the patient's condition evolves.      Eduard Clos, MD  Electronically Signed     ANK/MEDQ  D:  07/28/2007  T:  07/28/2007  Job:  [id number]

## 2010-07-10 NOTE — Discharge Summary (Signed)
NAME:  Wanda Brown, Wanda Brown              ACCOUNT NO.:  [id number]   MEDICAL RECORD NO.:  [id number]          PATIENT TYPE:  INP   LOCATION:  1421                         FACILITY:  [facility]   PHYSICIAN:  Hillery Aldo, M.D.   DATE OF BIRTH:  1951/02/07   DATE OF ADMISSION:  07/28/2007  DATE OF DISCHARGE:  07/29/2007                               DISCHARGE SUMMARY   PRIMARY CARE PHYSICIAN:  Dr. Della Goo.   DISCHARGE DIAGNOSES:  1. Atypical chest pain, acute coronary syndrome ruled out.  2. Severe dyslipidemia.  3. Seizure disorder.  4. Hypertension.  5. Obstructive sleep apnea.  6. Ongoing tobacco abuse.  7. History of schizophrenia  8. Tardive dyskinesia  9. Parkinson's disease.  10.Chronic obstructive pulmonary disease  11.History of brain tumor   DISCHARGE MEDICATIONS:  1. Crestor 10 mg daily.  2. Tricor 145 mg daily.  3. Wellbutrin 100 mg b.i.d.  4. Advair Diskus 100/50 b.i.d.  5. Atenolol 50 mg nightly.  6. Depakote ER 250 mg q.a.m., 500 mg q.p.m.  7. Paxil 40 mg nightly.  8. Multivitamin daily.  9. K-Dur 20 mEq daily.  10.Detrol LA 4 mg daily.  11.Maxzide 37.5/25 daily.  12.Atarax 15 mg nightly.  13.Prilosec 40 mg daily.  14.Flonase 1 spray in each nostril daily.  15.Xanax 0.5 mg b.i.d. p.r.n. anxiety.  16.Flexeril 10 mg nightly p.r.n. muscle spasm.  17.Lortab 10/500 q.6 h., p.r.n. pain.  18.Risperdal 1 mg nightly.  19.Albuterol 2.5 mg with Atrovent 0.5 mg via nebulizer q.i.d.  20.Aspirin 325 mg daily.   CONSULTATIONS:  None.   BRIEF ADMISSION HISTORY OF PRESENT ILLNESS:  The patient is a 60-year-  old female who was sent to the hospital for a chief complaint of chest  pain.  The chest pain was atypical in that it was stabbing in nature,  very short-lived and nonexertional.  She was admitted for further  evaluation and workup under 24 hour observation status.   PROCEDURES AND DIAGNOSTIC STUDIES:  1. Two-dimensional echocardiogram on July 28, 2007, showed  normal left      ventricular systolic function with an ejection fraction of 60-70%,      and no diagnostic evidence of left ventricular regional wall motion      abnormalities.  Left ventricular diastolic function parameters were      also normal.  2. Chest x-ray on July 27, 2007, showed no active cardiopulmonary      disease with changes of chronic obstructive pulmonary disease      noted.   DISCHARGE LABORATORY VALUES:  Cardiac enzymes were completely negative  x3 sets.  For other laboratory data, please see the admission H&P.   HOSPITAL COURSE:  1. Atypical chest pain:  The patient was admitted to the telemetry      unit and cardiac markers were cycled q.8 h., x3 sets and were      completely negative.  12-lead EKG tracings showed normal sinus      rhythm with no evidence of ST or T-wave abnormalities.  She was      further risk stratified with a fasting lipid panel and  she was      found to be markedly dyslipidemic.  A two-dimensional      echocardiogram was obtained with findings as noted above.  The      patient's chest pain completely resolved without any specific      intervention.  Her chest pain was not felt to be due to cardiac      issues.  2. Dyslipidemia:  The patient did have a fasting lipid panel checked      for further risk stratification.  Her total cholesterol was 274,      triglycerides 548, HDL 15, and LDL non-calculable due to the      elevated triglyceride values.  The patient was put on Crestor and      Tricor.  We recommended continuing this combination as other statin      therapy with Tricor is contraindicated due to black box warnings.  3. Seizure disorder:  The patient has remained seizure event free.  4. Hypertension:  The patient's blood pressure is well controlled on      the regimen as outlined above.  5. Obstructive sleep apnea:  The patient should continue his nocturnal      CPAP.  6. Tobacco abuse:  The patient has been counseled on cessation and  has      been provided with literature, however, she was not likely to quit      due to her underlying psychiatric issues.  7. History of schizophrenia:  The patient was maintained on her      psychoactive medications.  8. Tardive dyskinesia:  Stable.  9. Chronic obstructive pulmonary disease:  Stable.   DISPOSITION:  The patient is medically stable and will be discharged  back to her skilled nursing facility today.  She should follow up Dr.  Lovell Sheehan as scheduled.      Hillery Aldo, M.D.  Electronically Signed     CR/MEDQ  D:  07/29/2007  T:  07/29/2007  Job:  [id number]   cc:   Della Goo, M.D.  Fax: [phone]

## 2010-07-13 NOTE — Discharge Summary (Signed)
NAME:  Wanda Brown, Wanda Brown NO.:  [id number]   MEDICAL RECORD NO.:  [id number]          PATIENT TYPE:  IPS   LOCATION:  0407                          FACILITY:  [facility]   PHYSICIAN:  Geoffery Lyons, M.D.      DATE OF BIRTH:  May 18, 1950   DATE OF ADMISSION:  05/03/2004  DATE OF DISCHARGE:  05/09/2004                                 DISCHARGE SUMMARY   CHIEF COMPLAINT AND PRESENT ILLNESS:  This was the first admission to [facility] for this 60 year old African-American female.  Stated  that she was tired of being homeless, walked in front of a truck.  Came from  [place] voluntarily after suicidal gesture.  Admits she had been using  alcohol for 2-1/2 months, over a fifth of liquor per day.  Also reported  depression, sadness, crying daily, suicidal ideation, lonely, hopeless,  anorexic, anhedonic with insomnia.  History of bipolar with mood swings.  Impulsivity, violent tendencies.  Inability to hold employment due to anger  problems for 15 years.   PAST PSYCHIATRIC HISTORY:  First time at [facility].  Third  hospitalization __________ between 1995 and 2003.  Diagnosed bipolar.   ALCOHOL/DRUG HISTORY:  Significant history of alcohol abuse and marijuana  early on.   MEDICAL HISTORY:  Arterial hypertension, epilepsy.   MEDICATIONS:  Seroquel 400 mg at night and Neurontin.  Has not been  compliant.   PHYSICAL EXAMINATION:  Performed and failed to show any acute findings.   LABORATORY DATA:  CBC within normal limits.  Blood chemistry within normal  limits.  Liver enzymes with SGOT 13, SGPT 34.  TSH 1.605.  Urine drug screen  positive for marijuana and for benzodiazepines.   MENTAL STATUS EXAM:  Well-nourished, well-developed, alert, cooperative  female.  Speech clear, even tone, normal rate, tempo and production.  Mood  depressed.  Affect depressed, tearful, some anxiety, fidgety.  Thought  processes logical, coherent and relevant.  Endorsed  suicidal ideation.  No  evidence of delusions.  No homicidal ideation.  No hallucinations.  Cognition was well-preserved.   ADMISSION DIAGNOSES:   AXIS I:  1.  Bipolar disorder, depressed.  2.  Alcohol and marijuana abuse.   AXIS II:  No diagnosis.   AXIS III:  1.  Arterial hypertension.  2.  Epilepsy.   AXIS IV:  Moderate.   AXIS V:  Global Assessment of Functioning upon admission 30; highest Global  Assessment of Functioning in the last year 60-65.   HOSPITAL COURSE:  She was admitted and started in individual and group  psychotherapy.  She was given Ambien for sleep.  Was detoxified with  Librium.  Replaced potassium.  She was given Seroquel 200 mg at night and 50  mg every 4-6 hours as needed.  She was given Neurontin 300 mg twice a day.  She was given Protonix 40 mg per day and later she was started on Depakote  750 mg daily.  She endorsed multiple stressors.  Endorsed symptoms  suggestive of bipolarity as well as alcohol and marijuana abuse.  No  difficulty with her situation.  Being homeless.  No help from her family.  Claimed that she had tried to __________ before and when she needs their  help they are not available.  Very resentful, very irritable and anxious.  Continued to evidence irritability but able to refocus.  Working on getting  herself back together.  We worked with the Depakote and the Neurontin and  she started sleeping better.  She secured a placement.  She was denying any  suicidal or homicidal ideation.  On March 15th, she was in full contact with  reality.  No suicidal ideation, no homicidal ideation, no hallucinations, no  delusions.  She was willing and motivated to pursue further outpatient  treatment.  Motivated, encouraged, excited about opportunity ahead of her.   DISCHARGE DIAGNOSES:   AXIS I:  1.  Bipolar disorder, depressed.  2.  Alcohol and marijuana abuse.   AXIS II:  No diagnosis.   AXIS III:  1.  Arterial hypertension.  2.   Epilepsy.   AXIS IV:  Moderate.   AXIS V:  Global Assessment of Functioning upon discharge 50-55.   DISCHARGE MEDICATIONS:  1.  Seroquel 300 mg at night.  2.  Neurontin 300 mg twice a day.  3.  Protonix 40 mg per day.  4.  Depakote 750 mg at night.  5.  Ambien 10 mg at night for sleep.   FOLLOW UP:  [facility].     IL/MEDQ  D:  05/30/2004  T:  05/30/2004  Job:  [id number]
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
## 2010-07-13 NOTE — Op Note (Signed)
NAME:  Wanda Brown, Wanda Brown              ACCOUNT NO.:  [id number]   MEDICAL RECORD NO.:  [id number]          PATIENT TYPE:  AMB   LOCATION:  SDS                          FACILITY:  [facility]   PHYSICIAN:  Rodney A. Mortenson, M.D.DATE OF BIRTH:  1950/10/20   DATE OF PROCEDURE:  06/05/2006  DATE OF DISCHARGE:                               OPERATIVE REPORT   PREOPERATIVE DIAGNOSIS:  1. Full-thickness retracted rotator cuff tear, left shoulder.  2. Frayed biceps with damage to biceps anchor, left shoulder.   POSTOPERATIVE DIAGNOSES:  1. Frayed biceps and deformity by the superior biceps anchor, left      shoulder.  2. Full-thickness retracted rotator cuff tear, left shoulder.  3. Impingement, left shoulder, secondary to displaced greater      tuberosity fracture.   OPERATION:  1. Arthroscopic debridement of biceps and biceps anchor, left      shoulder.  2. Open low subacromial decompression.  3. Debulk displaced greater tuberosity fracture causing secondary      impingement and open rotator cuff repair and advancement with      three Mitek anchors.   SURGEON:  Lenard Galloway. Chaney Malling, M.D.   ASSISTANT:  Legrand Pitts. Duffy, P.A.-C.   ANESTHESIA:  General.   PROCEDURE:  The patient was placed on the table in the supine position.  After satisfactory anesthesia, the patient was placed in the semi-  sitting position.  Left upper extremity was prepped with DuraPrep,  draped out in the usual manner.  Through a posterior portal the  arthroscope was introduced and the anterior operative portal was  inserted.  Careful examination was undertaken.  There was marked  fraying of the biceps as it exited over the rotator cuff tear.  Proximally the biceps showed great deformity at the biceps anchor.  There was a history of fracture of the superior margin of the glenoid  and this involved the biceps anchor.  This was markedly deformed but was  stable.  There was marked fraying and tearing of the biceps and  the  biceps anchor.  Through anterior operative portal, intra-articular  shaver was introduced in the frayed and torn portion biceps was  debrided.  Articular cartilage about the glenoid appeared fairly normal.  Once this was done to my satisfaction scope was removed.   A saber-cut incision was made with over the anterolateral aspect of  shoulder.  Skin edges were retracted.  Bleeders were coagulated.  A 1 cm  section of deltoid was taken off the anterior aspect of the acromion and  then the deltoid was split with the fingers parallel to its fibers.  This gave excellent access to the subacromial space.  Bursa was removed.  The small subacromial decompression was done.  This gave much better  access.  With abduction and rotation, shoulder was mechanical blocked.  There was not soft tissue.  Palpation revealed a displaced greater  tuberosity fracture with attached impinging and hanging up on the  lateral edge of the acromion.  This clearly was called mechanical  symptoms.  The rotator cuff was mobilized.  This was still fully mobile.  It  was pulled down into the anatomic position, but there was great deal  of deformity in this area secondary to the fracture of the greater  tuberosity. Using osteotome, part of the greater tuberosity was excised  and the displaced fracture was debulked.  Once this was accomplished to  my satisfaction, there was much better movement about the shoulder, but  not full movement about the shoulder.  The fascia was irrigated with  copious amounts of saline solution.  At this point the rotator cuff  could be advanced down into an anatomic area and the whole head was now  covered with a thick portion of rotator cuff.  Two Mitek anchors were  inserted in the cuff, brought down to anatomic position and sutured in  place.  A third Mitek was used as backup to reinforce the first two  repairs.  Full range of motion was not  retained, but motion was  definitely improved  over the preoperative status where the displaced  fracture was impinging.  A watertight closure of the rotator cuff was  achieved.  Deltoid fibers were then reattached with 0 Vicryl and 2-0  Vicryl was used for the subcutaneous tissue. Stainless steel staples  used to close the skin.  Sterile dressing applied.  The patient returned  recovery room in nice condition.           ______________________________  Lenard Galloway Chaney Malling, M.D.     RAM/MEDQ  D:  06/05/2006  T:  06/05/2006  Job:  [id number]

## 2010-07-13 NOTE — Procedures (Signed)
NAME:  Wanda Brown, Wanda Brown              ACCOUNT NO.:  [id number]   MEDICAL RECORD NO.:  [id number]          PATIENT TYPE:  OUT   LOCATION:  SLEEP CENTER                 FACILITY[facility]  [facility]   PHYSICIAN:  Clinton D. Maple Hudson, M.D. DATE OF BIRTH:  February 01, 1951   DATE OF STUDY:  09/24/2004                              NOCTURNAL POLYSOMNOGRAM   REFERRING PHYSICIAN:  Dr. Della Goo   DATE OF STUDY:  September 24, 2004   INDICATION FOR STUDY:  Insomnia with sleep apnea.   EPWORTH SLEEPINESS SCORE:  6/24   BMI:  33   WEIGHT:  203 pounds   SLEEP ARCHITECTURE:  Total sleep time 397 minutes with sleep efficiency 85%.  Stage I was 4%, stage II 72%, stages III and IV 9%.  REM 15% of total sleep  time.  Sleep latency 6 minutes.  REM latency 146 minutes.  Awake after sleep  onset 65 minutes.  Arousal index 9.  No bedtime medication was reported.   RESPIRATORY DATA:  NPSG protocol requested.  Respiratory disturbance index  (RDI, AHI) 24.8 obstructive events per hour indicating moderate obstructive  sleep apnea/hypopnea syndrome.  There were 2 central apneas, 138 obstructive  apneas and 22 hypopneas.  Most events and most sleep were while supine.  REM  RDI 30.8.   OXYGEN DATA:  Moderately loud snoring and mouth breathing noted with oxygen  desaturation to a nadir of 80%.  Mean oxygen saturation improved after the  technician placed the patient on 1 L per minute supplemental oxygen by nasal  prongs.  Apparently, the patient sleeps at home on 1 L nasal oxygen and  technician reports that she slept better during the study once oxygen was  provided.  Mean saturation to the study was 91% on room air, but after  initiation of 1 L saturations held 95-98%.   CARDIAC DATA:  Normal sinus rhythm.   MOVEMENT/PARASOMNIA:  Occasional leg jerks with little effect on sleep.   IMPRESSION/RECOMMENDATION:  1.  Moderate obstructive sleep apnea/hypopnea syndrome.  RDI 24.8 per hour      with moderately loud snoring and  oxygen desaturation to 80%.  2.  Supplement oxygen was provided at 1 L per minute by nasal prong, holding      saturation 95-98%,  3.  Consider return for CPAP titration or evaluate for alternative therapies      as appropriate.      Clinton D. Maple Hudson, M.D.  Diplomat    CDY/MEDQ  D:  09/30/2004 10:29:25  T:  10/01/2004 09:18:36  Job:  [id number]
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
## 2010-11-13 ENCOUNTER — Other Ambulatory Visit: Payer: Self-pay | Admitting: Internal Medicine

## 2010-11-13 DIAGNOSIS — R1013 Epigastric pain: Secondary | ICD-10-CM

## 2010-11-19 ENCOUNTER — Inpatient Hospital Stay: Admission: RE | Admit: 2010-11-19 | Payer: PRIVATE HEALTH INSURANCE | Source: Ambulatory Visit

## 2010-11-21 LAB — CBC
HCT: 35.3 — ABNORMAL LOW
Hemoglobin: 12.1
MCHC: 34.4
MCV: 88.3
RBC: 3.99

## 2010-11-21 LAB — BASIC METABOLIC PANEL
CO2: 33 — ABNORMAL HIGH
Chloride: 98
GFR calc Af Amer: >60
Potassium: 3.7
Sodium: 139

## 2010-11-21 LAB — PROTIME-INR: INR: 0.9

## 2010-11-22 LAB — CARDIAC PANEL(CRET KIN+CKTOT+MB+TROPI)
CK, MB: 0.7
Relative Index: INVALID
Total CK: 89
Total CK: 99
Troponin I: 0.03
Troponin I: 0.04

## 2010-11-22 LAB — CBC
HCT: 36
Hemoglobin: 11.3 — ABNORMAL LOW
Hemoglobin: 12.6
MCHC: 35
MCV: 87.9
Platelets: 316
RBC: 3.65 — ABNORMAL LOW
RBC: 4.1
RDW: 13.4
WBC: 13.1 — ABNORMAL HIGH

## 2010-11-22 LAB — POCT I-STAT, CHEM 8
BUN: 5 — ABNORMAL LOW
Calcium, Ion: 1.13
Chloride: 103
Creatinine, Ser: 0.9
Glucose, Bld: 88
HCT: 35 — ABNORMAL LOW
Hemoglobin: 11.9 — ABNORMAL LOW
Potassium: 3.4 — ABNORMAL LOW
Sodium: 138
TCO2: 26

## 2010-11-22 LAB — DIFFERENTIAL
Basophils Absolute: 0.1
Basophils Absolute: 0.1
Basophils Relative: 1
Eosinophils Absolute: 0.9 — ABNORMAL HIGH
Eosinophils Relative: 7 — ABNORMAL HIGH
Lymphocytes Relative: 41
Lymphocytes Relative: 48 — ABNORMAL HIGH
Lymphs Abs: 6.3 — ABNORMAL HIGH
Monocytes Absolute: 0.9
Monocytes Absolute: 1.2 — ABNORMAL HIGH
Monocytes Relative: 9
Monocytes Relative: 9
Neutro Abs: 4.1
Neutro Abs: 4.6
Neutrophils Relative %: 35 — ABNORMAL LOW

## 2010-11-22 LAB — APTT: aPTT: 36

## 2010-11-22 LAB — POCT CARDIAC MARKERS
CKMB, poc: 1.1
Myoglobin, poc: 92.3
Operator id: 290111
Troponin i, poc: <0.05

## 2010-11-22 LAB — CK TOTAL AND CKMB (NOT AT ARMC)
CK, MB: 1
Relative Index: 0.9
Total CK: 114

## 2010-11-22 LAB — T4: T4, Total: 5.8

## 2010-11-22 LAB — BASIC METABOLIC PANEL
Calcium: 9.2
GFR calc Af Amer: >60
GFR calc non Af Amer: >60
Sodium: 137

## 2010-11-22 LAB — LIPID PANEL
HDL: 15 — ABNORMAL LOW
Total CHOL/HDL Ratio: 18.3
VLDL: UNDETERMINED

## 2010-11-22 LAB — HEPATIC FUNCTION PANEL
Bilirubin, Direct: <0.1
Total Bilirubin: 0.5

## 2010-11-22 LAB — PROTIME-INR
INR: 0.9
Prothrombin Time: 12.7

## 2010-11-22 LAB — TROPONIN I: Troponin I: 0.03

## 2010-11-22 LAB — T3, FREE: T3, Free: 2.4 (ref 2.3–4.2)

## 2010-11-30 ENCOUNTER — Emergency Department (HOSPITAL_COMMUNITY)
Admission: EM | Admit: 2010-11-30 | Discharge: 2010-11-30 | Disposition: A | Discharge status: Home / Self Care | Payer: PRIVATE HEALTH INSURANCE | Attending: Emergency Medicine | Admitting: Emergency Medicine

## 2010-11-30 ENCOUNTER — Emergency Department (HOSPITAL_COMMUNITY): Payer: PRIVATE HEALTH INSURANCE

## 2010-11-30 DIAGNOSIS — F319 Bipolar disorder, unspecified: Secondary | ICD-10-CM | POA: Insufficient documentation

## 2010-11-30 DIAGNOSIS — I10 Essential (primary) hypertension: Secondary | ICD-10-CM | POA: Insufficient documentation

## 2010-11-30 DIAGNOSIS — R10816 Epigastric abdominal tenderness: Secondary | ICD-10-CM | POA: Insufficient documentation

## 2010-11-30 DIAGNOSIS — G20A1 Parkinson's disease without dyskinesia, without mention of fluctuations: Secondary | ICD-10-CM | POA: Insufficient documentation

## 2010-11-30 DIAGNOSIS — R1110 Vomiting, unspecified: Secondary | ICD-10-CM | POA: Insufficient documentation

## 2010-11-30 DIAGNOSIS — Z79899 Other long term (current) drug therapy: Secondary | ICD-10-CM | POA: Insufficient documentation

## 2010-11-30 DIAGNOSIS — G20 Parkinson's disease: Secondary | ICD-10-CM | POA: Insufficient documentation

## 2010-11-30 DIAGNOSIS — R1013 Epigastric pain: Secondary | ICD-10-CM | POA: Insufficient documentation

## 2010-11-30 DIAGNOSIS — K219 Gastro-esophageal reflux disease without esophagitis: Secondary | ICD-10-CM | POA: Insufficient documentation

## 2010-11-30 DIAGNOSIS — G40909 Epilepsy, unspecified, not intractable, without status epilepticus: Secondary | ICD-10-CM | POA: Insufficient documentation

## 2010-11-30 DIAGNOSIS — J4489 Other specified chronic obstructive pulmonary disease: Secondary | ICD-10-CM | POA: Insufficient documentation

## 2010-11-30 DIAGNOSIS — J449 Chronic obstructive pulmonary disease, unspecified: Secondary | ICD-10-CM | POA: Insufficient documentation

## 2010-11-30 LAB — LIPASE, BLOOD: Lipase: 39 U/L (ref 11–59)

## 2010-11-30 LAB — URINALYSIS, ROUTINE W REFLEX MICROSCOPIC
Glucose, UA: NEGATIVE mg/dL
Hgb urine dipstick: NEGATIVE
Ketones, ur: NEGATIVE mg/dL
Leukocytes, UA: NEGATIVE
Protein, ur: NEGATIVE mg/dL
Urobilinogen, UA: 0.2 mg/dL (ref 0.0–1.0)

## 2010-11-30 LAB — POCT I-STAT TROPONIN I: Troponin i, poc: 0 ng/mL (ref 0.00–0.08)

## 2010-11-30 LAB — DIFFERENTIAL
Basophils Relative: 1 % (ref 0–1)
Eosinophils Absolute: 0.4 10*3/uL (ref 0.0–0.7)
Eosinophils Relative: 4 % (ref 0–5)
Monocytes Absolute: 0.7 10*3/uL (ref 0.1–1.0)
Monocytes Relative: 8 % (ref 3–12)
Neutro Abs: 4.4 10*3/uL (ref 1.7–7.7)

## 2010-11-30 LAB — COMPREHENSIVE METABOLIC PANEL
Alkaline Phosphatase: 97 U/L (ref 39–117)
BUN: 16 mg/dL (ref 6–23)
Chloride: 97 mEq/L (ref 96–112)
GFR calc Af Amer: 70 mL/min — ABNORMAL LOW (ref >90)
Glucose, Bld: 90 mg/dL (ref 70–99)
Potassium: 3.1 mEq/L — ABNORMAL LOW (ref 3.5–5.1)
Total Bilirubin: 0.4 mg/dL (ref 0.3–1.2)

## 2010-11-30 LAB — CBC
Hemoglobin: 13.5 g/dL (ref 12.0–15.0)
MCH: 30.8 pg (ref 26.0–34.0)
MCHC: 35.2 g/dL (ref 30.0–36.0)
RDW: 13.5 % (ref 11.5–15.5)

## 2010-12-01 LAB — URINE CULTURE

## 2010-12-12 ENCOUNTER — Encounter: Payer: Self-pay | Admitting: *Deleted

## 2010-12-12 ENCOUNTER — Telehealth: Payer: Self-pay | Admitting: Internal Medicine

## 2010-12-12 NOTE — Telephone Encounter (Signed)
Spoke with Sherri at Dr. Lovell Sheehan office. Patient scheduled with Mike Gip, PA on 12/14/10 at 10:00 AM.

## 2010-12-14 ENCOUNTER — Ambulatory Visit: Payer: PRIVATE HEALTH INSURANCE | Admitting: Physician Assistant

## 2011-05-05 ENCOUNTER — Emergency Department (HOSPITAL_COMMUNITY): Payer: PRIVATE HEALTH INSURANCE

## 2011-05-05 ENCOUNTER — Inpatient Hospital Stay (HOSPITAL_COMMUNITY)
Admission: EM | Admit: 2011-05-05 | Discharge: 2011-05-07 | DRG: 190 | Disposition: A | Discharge status: Home / Self Care | Payer: PRIVATE HEALTH INSURANCE | Attending: Internal Medicine | Admitting: Internal Medicine

## 2011-05-05 ENCOUNTER — Encounter (HOSPITAL_COMMUNITY): Payer: Self-pay | Admitting: *Deleted

## 2011-05-05 ENCOUNTER — Other Ambulatory Visit: Payer: Self-pay

## 2011-05-05 DIAGNOSIS — F1010 Alcohol abuse, uncomplicated: Secondary | ICD-10-CM | POA: Diagnosis present

## 2011-05-05 DIAGNOSIS — I10 Essential (primary) hypertension: Secondary | ICD-10-CM | POA: Diagnosis present

## 2011-05-05 DIAGNOSIS — F3289 Other specified depressive episodes: Secondary | ICD-10-CM | POA: Diagnosis present

## 2011-05-05 DIAGNOSIS — F411 Generalized anxiety disorder: Secondary | ICD-10-CM | POA: Diagnosis present

## 2011-05-05 DIAGNOSIS — F329 Major depressive disorder, single episode, unspecified: Secondary | ICD-10-CM | POA: Diagnosis present

## 2011-05-05 DIAGNOSIS — IMO0001 Reserved for inherently not codable concepts without codable children: Secondary | ICD-10-CM | POA: Diagnosis present

## 2011-05-05 DIAGNOSIS — I509 Heart failure, unspecified: Secondary | ICD-10-CM | POA: Diagnosis present

## 2011-05-05 DIAGNOSIS — F17200 Nicotine dependence, unspecified, uncomplicated: Secondary | ICD-10-CM | POA: Diagnosis present

## 2011-05-05 DIAGNOSIS — Z23 Encounter for immunization: Secondary | ICD-10-CM

## 2011-05-05 DIAGNOSIS — J189 Pneumonia, unspecified organism: Secondary | ICD-10-CM | POA: Diagnosis present

## 2011-05-05 DIAGNOSIS — M129 Arthropathy, unspecified: Secondary | ICD-10-CM | POA: Diagnosis present

## 2011-05-05 DIAGNOSIS — E785 Hyperlipidemia, unspecified: Secondary | ICD-10-CM | POA: Diagnosis present

## 2011-05-05 DIAGNOSIS — J441 Chronic obstructive pulmonary disease with (acute) exacerbation: Principal | ICD-10-CM | POA: Diagnosis present

## 2011-05-05 DIAGNOSIS — Z7982 Long term (current) use of aspirin: Secondary | ICD-10-CM

## 2011-05-05 DIAGNOSIS — Z79899 Other long term (current) drug therapy: Secondary | ICD-10-CM

## 2011-05-05 DIAGNOSIS — I5032 Chronic diastolic (congestive) heart failure: Secondary | ICD-10-CM | POA: Diagnosis present

## 2011-05-05 DIAGNOSIS — F419 Anxiety disorder, unspecified: Secondary | ICD-10-CM

## 2011-05-05 DIAGNOSIS — R918 Other nonspecific abnormal finding of lung field: Secondary | ICD-10-CM | POA: Diagnosis present

## 2011-05-05 HISTORY — DX: Shortness of breath: R06.02

## 2011-05-05 HISTORY — DX: Angina pectoris, unspecified: I20.9

## 2011-05-05 LAB — CBC
HCT: 37.6 % (ref 36.0–46.0)
HCT: 37.1 % (ref 36.0–46.0)
Hemoglobin: 13.1 g/dL (ref 12.0–15.0)
MCH: 30.3 pg (ref 26.0–34.0)
MCH: 30 pg (ref 26.0–34.0)
MCHC: 34.8 g/dL (ref 30.0–36.0)
MCV: 86.8 fL (ref 78.0–100.0)
MCV: 87.1 fL (ref 78.0–100.0)
Platelets: 308 10*3/uL (ref 150–400)
Platelets: 322 10*3/uL (ref 150–400)
RDW: 14.1 % (ref 11.5–15.5)
WBC: 8.8 10*3/uL (ref 4.0–10.5)

## 2011-05-05 LAB — TROPONIN I: Troponin I: <0.3 ng/mL (ref <0.30)

## 2011-05-05 LAB — PROTIME-INR: Prothrombin Time: 13.4 seconds (ref 11.6–15.2)

## 2011-05-05 LAB — PRO B NATRIURETIC PEPTIDE: Pro B Natriuretic peptide (BNP): 147.7 pg/mL — ABNORMAL HIGH (ref 0–125)

## 2011-05-05 LAB — COMPREHENSIVE METABOLIC PANEL
AST: 20 U/L (ref 0–37)
Albumin: 3.8 g/dL (ref 3.5–5.2)
BUN: 15 mg/dL (ref 6–23)
Calcium: 9.6 mg/dL (ref 8.4–10.5)
Creatinine, Ser: 0.8 mg/dL (ref 0.50–1.10)
GFR calc non Af Amer: 77 mL/min — ABNORMAL LOW (ref >90)
Total Bilirubin: 0.3 mg/dL (ref 0.3–1.2)

## 2011-05-05 LAB — BASIC METABOLIC PANEL
CO2: 23 mEq/L (ref 19–32)
Calcium: 9.7 mg/dL (ref 8.4–10.5)
Creatinine, Ser: 0.76 mg/dL (ref 0.50–1.10)
GFR calc non Af Amer: 89 mL/min — ABNORMAL LOW (ref >90)

## 2011-05-05 LAB — DIFFERENTIAL
Basophils Absolute: 0.1 10*3/uL (ref 0.0–0.1)
Eosinophils Absolute: 0.2 10*3/uL (ref 0.0–0.7)
Eosinophils Relative: 2 % (ref 0–5)
Lymphocytes Relative: 26 % (ref 12–46)
Lymphs Abs: 2.5 10*3/uL (ref 0.7–4.0)
Neutrophils Relative %: 69 % (ref 43–77)

## 2011-05-05 LAB — APTT: aPTT: 38 seconds — ABNORMAL HIGH (ref 24–37)

## 2011-05-05 LAB — LIPID PANEL
HDL: 35 mg/dL — ABNORMAL LOW (ref >39)
LDL Cholesterol: 151 mg/dL — ABNORMAL HIGH (ref 0–99)
Triglycerides: 109 mg/dL (ref <150)
VLDL: 22 mg/dL (ref 0–40)

## 2011-05-05 LAB — MAGNESIUM: Magnesium: 2.1 mg/dL (ref 1.5–2.5)

## 2011-05-05 LAB — CARDIAC PANEL(CRET KIN+CKTOT+MB+TROPI)
Relative Index: 1.1 (ref 0.0–2.5)
Troponin I: <0.3 ng/mL (ref <0.30)

## 2011-05-05 LAB — PHOSPHORUS: Phosphorus: 3.2 mg/dL (ref 2.3–4.6)

## 2011-05-05 MED ORDER — B COMPLEX PO TABS: 1.0000 | ORAL_TABLET | Freq: Every day | ORAL | Status: DC

## 2011-05-05 MED ORDER — ASPIRIN 325 MG PO TABS: 325.0000 mg | ORAL_TABLET | Freq: Every day | ORAL | Status: DC

## 2011-05-05 MED ORDER — METHYLPREDNISOLONE SODIUM SUCC 125 MG IJ SOLR
60.0000 mg | Freq: Two times a day (BID) | INTRAMUSCULAR | Status: DC
  Administered 2011-05-05 – 2011-05-06 (×2): 60 mg via INTRAVENOUS
  Filled 2011-05-05: qty 2
  Filled 2011-05-05 (×2): qty 0.96

## 2011-05-05 MED ORDER — ASPIRIN 81 MG PO CHEW
CHEWABLE_TABLET | ORAL | Status: AC
  Filled 2011-05-05: qty 4

## 2011-05-05 MED ORDER — DEXTROSE 5 % IV SOLN
1.0000 g | INTRAVENOUS | Status: DC
  Administered 2011-05-06: 1 g via INTRAVENOUS
  Filled 2011-05-05 (×2): qty 10

## 2011-05-05 MED ORDER — OXYCODONE HCL 20 MG PO TB12: 20.0000 mg | ORAL_TABLET | Freq: Two times a day (BID) | ORAL | Status: DC | PRN

## 2011-05-05 MED ORDER — MELOXICAM 15 MG PO TABS
15.0000 mg | ORAL_TABLET | Freq: Every day | ORAL | Status: DC
  Administered 2011-05-05 – 2011-05-07 (×3): 15 mg via ORAL
  Filled 2011-05-05 (×3): qty 1

## 2011-05-05 MED ORDER — MULTIVITAMINS PO CAPS
1.0000 | ORAL_CAPSULE | Freq: Every day | ORAL | Status: DC
Start: 2011-05-05 — End: 2011-05-05

## 2011-05-05 MED ORDER — ALPRAZOLAM 0.5 MG PO TABS: 0.5000 mg | ORAL_TABLET | Freq: Every evening | ORAL | Status: DC | PRN

## 2011-05-05 MED ORDER — IPRATROPIUM BROMIDE 0.02 % IN SOLN
0.5000 mg | Freq: Four times a day (QID) | RESPIRATORY_TRACT | Status: DC
  Administered 2011-05-05 – 2011-05-07 (×6): 0.5 mg via RESPIRATORY_TRACT
  Filled 2011-05-05 (×6): qty 2.5

## 2011-05-05 MED ORDER — SODIUM CHLORIDE 0.9 % IJ SOLN
3.0000 mL | Freq: Two times a day (BID) | INTRAMUSCULAR | Status: DC
  Administered 2011-05-05 – 2011-05-07 (×4): 3 mL via INTRAVENOUS

## 2011-05-05 MED ORDER — ZIPRASIDONE HCL 40 MG PO CAPS
40.0000 mg | ORAL_CAPSULE | Freq: Every day | ORAL | Status: DC
  Administered 2011-05-06 – 2011-05-07 (×2): 40 mg via ORAL
  Filled 2011-05-05 (×3): qty 1

## 2011-05-05 MED ORDER — ALBUTEROL SULFATE (5 MG/ML) 0.5% IN NEBU: 2.5000 mg | INHALATION_SOLUTION | Freq: Four times a day (QID) | RESPIRATORY_TRACT | Status: AC | PRN

## 2011-05-05 MED ORDER — DOCUSATE SODIUM 100 MG PO CAPS
100.0000 mg | ORAL_CAPSULE | Freq: Two times a day (BID) | ORAL | Status: DC
  Administered 2011-05-05 – 2011-05-07 (×4): 100 mg via ORAL
  Filled 2011-05-05 (×5): qty 1

## 2011-05-05 MED ORDER — FENOFIBRATE 54 MG PO TABS
54.0000 mg | ORAL_TABLET | Freq: Every day | ORAL | Status: DC
  Administered 2011-05-05 – 2011-05-07 (×3): 54 mg via ORAL
  Filled 2011-05-05 (×3): qty 1

## 2011-05-05 MED ORDER — FUROSEMIDE 20 MG PO TABS
20.0000 mg | ORAL_TABLET | Freq: Two times a day (BID) | ORAL | Status: DC
  Administered 2011-05-05 – 2011-05-07 (×4): 20 mg via ORAL
  Filled 2011-05-05 (×6): qty 1

## 2011-05-05 MED ORDER — ADULT MULTIVITAMIN W/MINERALS CH
1.0000 | ORAL_TABLET | Freq: Every day | ORAL | Status: DC
  Administered 2011-05-06 – 2011-05-07 (×2): 1 via ORAL
  Filled 2011-05-05 (×2): qty 1

## 2011-05-05 MED ORDER — DEXTROSE 5 % IV SOLN
500.0000 mg | INTRAVENOUS | Status: DC
  Administered 2011-05-06: 500 mg via INTRAVENOUS
  Filled 2011-05-05 (×2): qty 500

## 2011-05-05 MED ORDER — ASPIRIN 325 MG PO TABS
325.0000 mg | ORAL_TABLET | ORAL | Status: AC
  Administered 2011-05-05: 325 mg via ORAL
  Filled 2011-05-05: qty 1

## 2011-05-05 MED ORDER — ASPIRIN 325 MG PO TABS
325.0000 mg | ORAL_TABLET | ORAL | Status: AC
  Administered 2011-05-05: 325 mg via ORAL

## 2011-05-05 MED ORDER — PREDNISONE 20 MG PO TABS
60.0000 mg | ORAL_TABLET | Freq: Once | ORAL | Status: AC
  Administered 2011-05-05: 60 mg via ORAL
  Filled 2011-05-05: qty 3

## 2011-05-05 MED ORDER — ATENOLOL 50 MG PO TABS
50.0000 mg | ORAL_TABLET | Freq: Every day | ORAL | Status: DC
  Administered 2011-05-05 – 2011-05-06 (×2): 50 mg via ORAL
  Filled 2011-05-05 (×3): qty 1

## 2011-05-05 MED ORDER — DICYCLOMINE HCL 10 MG PO CAPS
10.0000 mg | ORAL_CAPSULE | Freq: Three times a day (TID) | ORAL | Status: DC
  Administered 2011-05-05 – 2011-05-07 (×6): 10 mg via ORAL
  Filled 2011-05-05 (×11): qty 1

## 2011-05-05 MED ORDER — ENOXAPARIN SODIUM 30 MG/0.3ML ~~LOC~~ SOLN: 30.0000 mg | SUBCUTANEOUS | Status: DC

## 2011-05-05 MED ORDER — ENOXAPARIN SODIUM 40 MG/0.4ML ~~LOC~~ SOLN
40.0000 mg | SUBCUTANEOUS | Status: DC
  Administered 2011-05-05 – 2011-05-06 (×2): 40 mg via SUBCUTANEOUS
  Filled 2011-05-05 (×3): qty 0.4

## 2011-05-05 MED ORDER — HYDROXYZINE HCL 25 MG PO TABS
25.0000 mg | ORAL_TABLET | Freq: Three times a day (TID) | ORAL | Status: DC | PRN
  Filled 2011-05-05: qty 1

## 2011-05-05 MED ORDER — ALBUTEROL SULFATE (5 MG/ML) 0.5% IN NEBU
2.5000 mg | INHALATION_SOLUTION | Freq: Four times a day (QID) | RESPIRATORY_TRACT | Status: DC
  Administered 2011-05-05 – 2011-05-07 (×6): 2.5 mg via RESPIRATORY_TRACT
  Filled 2011-05-05 (×6): qty 0.5

## 2011-05-05 MED ORDER — DEXTROSE 5 % IV SOLN
1.0000 g | Freq: Once | INTRAVENOUS | Status: AC
  Administered 2011-05-05: 1 g via INTRAVENOUS
  Filled 2011-05-05: qty 10

## 2011-05-05 MED ORDER — ZIPRASIDONE HCL 60 MG PO CAPS
120.0000 mg | ORAL_CAPSULE | Freq: Every day | ORAL | Status: DC
Start: 2011-05-05 — End: 2011-05-07
  Administered 2011-05-05 – 2011-05-06 (×2): 120 mg via ORAL
  Filled 2011-05-05 (×3): qty 2

## 2011-05-05 MED ORDER — IOHEXOL 300 MG/ML  SOLN
80.0000 mL | Freq: Once | INTRAMUSCULAR | Status: AC | PRN
  Administered 2011-05-05: 80 mL via INTRAVENOUS

## 2011-05-05 MED ORDER — TRIAMTERENE-HCTZ 37.5-25 MG PO CAPS
1.0000 | ORAL_CAPSULE | Freq: Every day | ORAL | Status: DC
  Administered 2011-05-06 – 2011-05-07 (×2): 1 via ORAL
  Filled 2011-05-05 (×2): qty 1

## 2011-05-05 MED ORDER — B COMPLEX-C PO TABS
1.0000 | ORAL_TABLET | Freq: Every day | ORAL | Status: DC
  Administered 2011-05-05 – 2011-05-07 (×3): 1 via ORAL
  Filled 2011-05-05 (×3): qty 1

## 2011-05-05 MED ORDER — AZITHROMYCIN 250 MG PO TABS
500.0000 mg | ORAL_TABLET | Freq: Once | ORAL | Status: AC
  Administered 2011-05-05: 500 mg via ORAL
  Filled 2011-05-05: qty 2
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
## 2011-05-05 NOTE — ED Notes (Signed)
Patient reports she has had right sided chest pain for 1 week,  Her sx were worse last night with heaviness in her chest and sob.  Patient reports she has also had nausea.  Patient denies cardiac hx
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
## 2011-05-06 LAB — CBC
HCT: 39.8 % (ref 36.0–46.0)
Hemoglobin: 13.4 g/dL (ref 12.0–15.0)
MCHC: 33.7 g/dL (ref 30.0–36.0)
MCV: 87.3 fL (ref 78.0–100.0)

## 2011-05-06 LAB — URINALYSIS, ROUTINE W REFLEX MICROSCOPIC
Bilirubin Urine: NEGATIVE
Glucose, UA: NEGATIVE mg/dL
Hgb urine dipstick: NEGATIVE
Ketones, ur: NEGATIVE mg/dL
Leukocytes, UA: NEGATIVE
Protein, ur: NEGATIVE mg/dL
pH: 6 (ref 5.0–8.0)

## 2011-05-06 LAB — CARDIAC PANEL(CRET KIN+CKTOT+MB+TROPI)
CK, MB: 3 ng/mL (ref 0.3–4.0)
Relative Index: 1.1 (ref 0.0–2.5)
Total CK: 267 U/L — ABNORMAL HIGH (ref 7–177)
Total CK: 204 U/L — ABNORMAL HIGH (ref 7–177)
Troponin I: <0.3 ng/mL (ref <0.30)
Troponin I: <0.3 ng/mL (ref <0.30)

## 2011-05-06 LAB — COMPREHENSIVE METABOLIC PANEL
ALT: 19 U/L (ref 0–35)
Albumin: 3.5 g/dL (ref 3.5–5.2)
Calcium: 9.7 mg/dL (ref 8.4–10.5)
GFR calc Af Amer: >90 mL/min (ref >90)
Glucose, Bld: 138 mg/dL — ABNORMAL HIGH (ref 70–99)
Potassium: 4 mEq/L (ref 3.5–5.1)
Sodium: 140 mEq/L (ref 135–145)
Total Protein: 7.3 g/dL (ref 6.0–8.3)

## 2011-05-06 MED ORDER — METHYLPREDNISOLONE SODIUM SUCC 40 MG IJ SOLR
40.0000 mg | Freq: Two times a day (BID) | INTRAMUSCULAR | Status: DC
  Administered 2011-05-06 – 2011-05-07 (×2): 40 mg via INTRAVENOUS
  Filled 2011-05-06 (×3): qty 1

## 2011-05-06 NOTE — Progress Notes (Signed)
Patient ID: Wanda Brown, female   DOB: Sep 17, 1950, 61 y.o.   MRN: [id number]  Subjective: No events overnight. Patient denies chest pain, shortness of breath, abdominal pain. Had bowel movement and reports ambulating.  Objective:  Vital signs in last 24 hours:  Filed Vitals:   05/06/11 1013 05/06/11 1409 05/06/11 1512 05/06/11 1600  BP: 131/57 130/68    Pulse: 55 58  78  Temp: 97.6 F (36.4 C) 97.2 F (36.2 C)    TempSrc: Oral Oral    Resp: 18 16    Height:      Weight:      SpO2: 100% 99% 98% 95%    Intake/Output from previous day:   Intake/Output Summary (Last 24 hours) at 05/06/11 2042 Last data filed at 05/06/11 1700  Gross per 24 hour  Intake   1483 ml  Output   2000 ml  Net   -517 ml    Physical Exam: General: Alert, awake, oriented x3, in no acute distress. HEENT: No bruits, no goiter. Moist mucous membranes, no scleral icterus, no conjunctival pallor. Heart: Regular rate and rhythm, S1/S2 +, no murmurs, rubs, gallops. Lungs: Clear to auscultation bilaterally. No wheezing, no rhonchi, no rales.  Abdomen: Soft, nontender, nondistended, positive bowel sounds. Extremities: No clubbing or cyanosis, no pitting edema,  positive pedal pulses. Neuro: Grossly nonfocal.  Lab Results:  Basic Metabolic Panel:    Component Value Date/Time   NA 140 05/06/2011 0550   K 4.0 05/06/2011 0550   CL 106 05/06/2011 0550   CO2 23 05/06/2011 0550   BUN 15 05/06/2011 0550   CREATININE 0.74 05/06/2011 0550   GLUCOSE 138* 05/06/2011 0550   CALCIUM 9.7 05/06/2011 0550   CBC:    Component Value Date/Time   WBC 9.8 05/06/2011 0550   HGB 13.4 05/06/2011 0550   HCT 39.8 05/06/2011 0550   PLT 350 05/06/2011 0550   MCV 87.3 05/06/2011 0550   NEUTROABS 6.5 05/05/2011 1836   LYMPHSABS 2.5 05/05/2011 1836   MONOABS 0.2 05/05/2011 1836   EOSABS 0.2 05/05/2011 1836   BASOSABS 0.1 05/05/2011 1836      Lab 05/06/11 0550 05/05/11 1836 05/05/11 1457  WBC 9.8 9.5 8.8  HGB 13.4 12.8 13.1  HCT  39.8 37.1 37.6  PLT 350 322 308  MCV 87.3 87.1 86.8  MCH 29.4 30.0 30.3  MCHC 33.7 34.5 34.8  RDW 14.0 14.1 13.9  LYMPHSABS -- 2.5 --  MONOABS -- 0.2 --  EOSABS -- 0.2 --  BASOSABS -- 0.1 --  BANDABS -- -- --    Lab 05/06/11 0550 05/05/11 1836 05/05/11 1457  NA 140 139 138  K 4.0 4.2 4.0  CL 106 103 104  CO2 23 25 23   GLUCOSE 138* 133* 95  BUN 15 15 15   CREATININE 0.74 0.80 0.76  CALCIUM 9.7 9.6 9.7  MG -- 2.1 --    Lab 05/05/11 1836  INR 1.00  PROTIME --   Cardiac markers:  Lab 05/06/11 1025 05/06/11 0236 05/05/11 1837  CKMB 2.7 3.0 3.5  TROPONINI <0.30 <0.30 <0.30  MYOGLOBIN -- -- --   No components found with this basename: POCBNP:3 Recent Results (from the past 240 hour(s))  MRSA PCR SCREENING     Status: Normal   Collection Time   05/06/11 12:00 AM      Component Value Range Status Comment   MRSA by PCR NEGATIVE  NEGATIVE  Final     Studies/Results: Dg Chest 2 View  05/05/2011  *  RADIOLOGY REPORT*  Clinical Data: Chest pain and history of tobacco use.  CHEST - 2 VIEW  Comparison: 03/16/2010  Findings: Stable mild chronic lung disease.  There is suggestion of subtle potential nodular density in the right upper lung that was not definitely present on prior chest radiographs.  This is difficult to measure given its vague margination and proximity to some peripheral blood vessels and the superior margin of the right sixth rib.  The area of nodularity is roughly on the order of 9 mm.  The heart size is normal.  No pleural effusions.  The bony thorax is unremarkable.  IMPRESSION: Possible subtle new area of nodularity in the right upper lung measuring roughly 9 mm.  This is very poorly marginated and may represent overlapping vessels.  Depending on disposition of the patient, options for further follow-up include short term interval chest x-ray or chest CT.  Original Report Authenticated By: Reola Calkins, M.D.   Ct Chest W Contrast  05/05/2011  *RADIOLOGY REPORT*   Clinical Data: Right lung nodule.  CT CHEST WITH CONTRAST  Technique:  Multidetector CT imaging of the chest was performed following the standard protocol during bolus administration of intravenous contrast.  Contrast: 80mL OMNIPAQUE IOHEXOL 300 MG/ML IJ SOLN  Comparison: Chest x-ray 05/05/2011.  Findings: The chest wall is unremarkable.  No breast masses, supraclavicular or axillary lymphadenopathy.  The bony thorax is intact.  The heart is normal in size.  No pericardial effusion.  No mediastinal or hilar lymphadenopathy.  Small scattered lymph nodes are noted.  The aorta is normal in caliber.  No dissection.  Mild atherosclerotic calcifications.  The esophagus demonstrates distal wall thickening.  This could be due to reflux esophagitis but recommend correlation with clinical findings of dysphagia.  Examination of the lung parenchyma demonstrates an 11 mm nodule in the right upper lobe correlate with the chest x-ray findings. There is a smaller more ill-defined 7.5 mm density in the right middle lobe on image number 25.  There are underlying emphysematous changes.  No acute pulmonary findings.  Mild dependent bibasilar atelectasis.  The upper abdomen is unremarkable.  IMPRESSION:  1.  11 mm spiculated lesion in the right upper lobe worrisome for small neoplasm.  Recommend PET CT for further evaluation. 2.  7.5 mm nodule in the right middle lobe requires followup. 3.  Emphysematous changes. 4.  No mediastinal or hilar lymphadenopathy. 5.  Mild distal esophageal wall thickening.  This may be due to reflux esophagitis.  Recommend clinical correlation.  Original Report Authenticated By: P. Loralie Champagne, M.D.    Medications: Scheduled Meds:   . albuterol  2.5 mg Nebulization Q6H  . aspirin  325 mg Oral STAT  . atenolol  50 mg Oral QHS  . azithromycin  500 mg Intravenous Q24H  . B-complex with vitamin C  1 tablet Oral Daily  . cefTRIAXone (ROCEPHIN)  IV  1 g Intravenous Q24H  . dicyclomine  10 mg Oral TID  AC & HS  . docusate sodium  100 mg Oral BID  . enoxaparin (LOVENOX) injection  40 mg Subcutaneous Q24H  . fenofibrate  54 mg Oral Daily  . furosemide  20 mg Oral BID  . ipratropium  0.5 mg Nebulization Q6H  . meloxicam  15 mg Oral Daily  . methylPREDNISolone (SOLU-MEDROL) injection  60 mg Intravenous Q12H  . mulitivitamin with minerals  1 tablet Oral Daily  . sodium chloride  3 mL Intravenous Q12H  . triamterene-hydrochlorothiazide  1 each Oral  Daily  . ziprasidone  120 mg Oral Q supper  . ziprasidone  40 mg Oral Q breakfast  . DISCONTD: b complex vitamins  1 tablet Oral Daily  . DISCONTD: enoxaparin  30 mg Subcutaneous Q24H  . DISCONTD: multivitamin  1 capsule Oral Daily   Continuous Infusions:  PRN Meds:.albuterol, ALPRAZolam, hydrOXYzine, oxyCODONE  Assessment/Plan:  *COPD with acute exacerbation  - we will continue nebulizer treatments (albuterol and atrovent) every 6 hours scheduled  - solumedrol 60 mg IV Q 12 Hours  - provide oxygen support via nasal canula to keep O2 saturation above 9%  - provide antibiotics for possible community acquired pneumonia (azithromycin and ceftriaxone)   Active Problems:   DYSLIPIDEMIA  - obtain fasting lipid panel  - continue tricor   HYPERTENSION  - continue home medication   Pulmonary nodules  - 11 mm spiculated lesion in the right upper lobe worrisome for small neoplasm. Recommend PET CT for further evaluation. There is also a 7.5 mm nodule in the right middle lobe which requires followup.   Diastolic CHF, chronic  - grade I diastolic disfunction with slight elevation in BNP  - obtain 2 D ECHO  - cycle cardiac enzyme for total of 3 sets ( 1st set cardia enzymes are negative)  - daily weight  - strict I&Os  - follow up TSH, A1C  - continue tricor  - lasix 20 mg BID PO   Community acquired pneumonia  - continue azithromycin and ceftriaxone  - follow up blood cultures   ANXIETY AND DEPRESSION  - continue geodon as per  patient's home regimen BID     LOS: 1 day   MAGICK-Tamim Skog 05/06/2011, 8:42 PM  [facility] Pager: [phone]
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
## 2011-05-06 NOTE — Progress Notes (Signed)
Pt. scheduled to take PM med.(atenolol) with BP was 130/56, HR 65, and asymptomatic. Notified provider on-call Craige Cotta, K, NP) and she advised ok to give med. Administered med. to patient.

## 2011-05-06 NOTE — Progress Notes (Signed)
Utilization review completed. Wanda Brown 05/06/2011 

## 2011-05-06 NOTE — Progress Notes (Signed)
  Echocardiogram 2D Echocardiogram has been performed.  Judyann Casasola L 05/06/2011, 10:06 AM

## 2011-05-07 LAB — URINE CULTURE
Colony Count: 8000
Culture  Setup Time: 201303110851

## 2011-05-07 LAB — BASIC METABOLIC PANEL
BUN: 23 mg/dL (ref 6–23)
CO2: 26 mEq/L (ref 19–32)
Chloride: 103 mEq/L (ref 96–112)
GFR calc non Af Amer: 55 mL/min — ABNORMAL LOW (ref >90)
Glucose, Bld: 115 mg/dL — ABNORMAL HIGH (ref 70–99)
Potassium: 3.5 mEq/L (ref 3.5–5.1)

## 2011-05-07 LAB — CBC
HCT: 36.2 % (ref 36.0–46.0)
Hemoglobin: 12.3 g/dL (ref 12.0–15.0)
MCHC: 34 g/dL (ref 30.0–36.0)
RBC: 4.16 MIL/uL (ref 3.87–5.11)

## 2011-05-07 MED ORDER — ALBUTEROL SULFATE (5 MG/ML) 0.5% IN NEBU
2.5000 mg | INHALATION_SOLUTION | RESPIRATORY_TRACT | Status: DC | PRN
  Filled 2011-05-07: qty 0.5

## 2011-05-07 MED ORDER — IPRATROPIUM BROMIDE 0.02 % IN SOLN
0.5000 mg | Freq: Three times a day (TID) | RESPIRATORY_TRACT | Status: DC
  Administered 2011-05-07 (×2): 0.5 mg via RESPIRATORY_TRACT
  Filled 2011-05-07 (×2): qty 2.5

## 2011-05-07 MED ORDER — AZITHROMYCIN 250 MG PO TABS: 250.0000 mg | ORAL_TABLET | Freq: Every day | ORAL | Status: DC

## 2011-05-07 MED ORDER — ALBUTEROL SULFATE (5 MG/ML) 0.5% IN NEBU
2.5000 mg | INHALATION_SOLUTION | Freq: Three times a day (TID) | RESPIRATORY_TRACT | Status: DC
  Administered 2011-05-07 (×2): 2.5 mg via RESPIRATORY_TRACT
  Filled 2011-05-07: qty 0.5

## 2011-05-07 NOTE — Progress Notes (Signed)
1700 discharge instructions and prescriptions given to pt verbalized understanding .  To lobby by NT / wheelchair

## 2011-05-07 NOTE — Discharge Summary (Signed)
Patient ID: Wanda Brown MRN: [id number] DOB/AGE: 11/15/1950 61 y.o.  Admit date: 05/05/2011 Discharge date: 05/07/2011  Primary Care Physician:  No primary provider on file.  Discharge Diagnoses:    Present on Admission:  .COPD with acute exacerbation .HYPERTENSION .Pulmonary nodules .Diastolic CHF, chronic .Community acquired pneumonia .Anxiety and depression  Principal Problem:  *COPD with acute exacerbation Active Problems:  HYPERLIPIDEMIA  HYPERTENSION  Pulmonary nodules  Diastolic CHF, chronic  Community acquired pneumonia  Anxiety and depression   Medication List  As of 05/07/2011  2:08 PM   STOP taking these medications         atenolol 50 MG tablet         TAKE these medications         ALPRAZolam 0.5 MG tablet   Commonly known as: XANAX   Take 0.5 mg by mouth. Take one tablet two times a day for anxiety as needed      aspirin 325 MG tablet   Take 325 mg by mouth daily.      atenolol 100 MG tablet   Commonly known as: TENORMIN   Take 100 mg by mouth at bedtime.      azithromycin 250 MG tablet   Commonly known as: ZITHROMAX   Take 1 tablet (250 mg total) by mouth daily.      b complex vitamins tablet   Take 1 tablet by mouth daily.      dicyclomine 10 MG capsule   Commonly known as: BENTYL   Take 10 mg by mouth. Take one capsule by mouth every 6 hours as needed      fenofibrate 145 MG tablet   Commonly known as: TRICOR   Take 145 mg by mouth daily.      hydrOXYzine 25 MG tablet   Commonly known as: ATARAX/VISTARIL   Take 25 mg by mouth as needed.      hydrOXYzine 50 MG tablet   Commonly known as: ATARAX/VISTARIL   Take 50 mg by mouth at bedtime.      ipratropium-albuterol 0.5-2.5 (3) MG/3ML Soln   Commonly known as: DUONEB   Take 3 mLs by nebulization as needed.      meloxicam 15 MG tablet   Commonly known as: MOBIC   Take 15 mg by mouth daily.      oxyCODONE 20 MG 12 hr tablet   Commonly known as: OXYCONTIN   Take 20 mg by  mouth every 12 (twelve) hours as needed. For pain      potassium chloride 20 MEQ packet   Commonly known as: KLOR-CON   Take 20 mEq by mouth daily.      triamterene-hydrochlorothiazide 37.5-25 MG per capsule   Commonly known as: DYAZIDE   Take 1 capsule by mouth every morning.      ziprasidone 60 MG capsule   Commonly known as: GEODON   Take 120 mg by mouth at bedtime.      ziprasidone 20 MG capsule   Commonly known as: GEODON   Take 40 mg by mouth every morning.         ASK your doctor about these medications         multivitamin capsule   Take 1 capsule by mouth daily.            Disposition and Follow-up: Pt will need to see PCP in 2 weeks post discharge. I have also schedule an appointment for PET scan for further evaluation for ? Metastatic lung cancer. This was  discussed with pt in detail and i have also scheduled an appointment with pulmonologist on March 25th, 2013 right after the PET scan. Please review the results and discuss with pt necessary evaluation if indicated.   Consults:  none  Significant Diagnostic Studies:   Dg Chest 2 View 05/05/2011  IMPRESSION: Possible subtle new area of nodularity in the right upper lung measuring roughly 9 mm.  This is very poorly marginated and may represent overlapping vessels.  Depending on disposition of the patient, options for further follow-up include short term interval chest x-ray or chest CT.    Ct Chest W Contrast 05/05/2011   IMPRESSION:  1.  11 mm spiculated lesion in the right upper lobe worrisome for small neoplasm.  Recommend PET CT for further evaluation. 2.  7.5 mm nodule in the right middle lobe requires followup. 3.  Emphysematous changes. 4.  No mediastinal or hilar lymphadenopathy. 5.  Mild distal esophageal wall thickening.  This may be due to reflux esophagitis.    Brief H and P: 61 year old female with past medical history including but not limited to HTN, COPD, Dyslipidemia, anxiety and depression who  presents to [facility] ED with main concern of progressively worsening, substernal, pressure -like chest pain that started morning of admission. She describes the pain as intermittent in nature and radiating to her neck bilaterally, each episode lasting 5 - 15 minutes with no specific aggravating or alleviating factors. She reports intensity at worse is 7/10. Pt denies similar episodes in the past, no cough, no fevers or chills, no abdominal or urinary concerns.  Physical Exam on Discharge:  Filed Vitals:   05/07/11 0808 05/07/11 0820 05/07/11 1230 05/07/11 1325  BP:  113/52 122/68 124/71  Pulse:  60 58 54  Temp:  98.8 F (37.1 C) 97.8 F (36.6 C) 97.8 F (36.6 C)  TempSrc:  Oral Oral Oral  Resp:  18 18 18   Height:      Weight:      SpO2: 100% 98% 98% 97%     Intake/Output Summary (Last 24 hours) at 05/07/11 1408 Last data filed at 05/07/11 1200  Gross per 24 hour  Intake   1143 ml  Output   1650 ml  Net   -507 ml    General: Alert, awake, oriented x3, in no acute distress. HEENT: No bruits, no goiter. Heart: Regular rate and rhythm, without murmurs, rubs, gallops. Lungs: Clear to auscultation bilaterally. Abdomen: Soft, nontender, nondistended, positive bowel sounds. Extremities: No clubbing cyanosis or edema with positive pedal pulses. Neuro: Grossly intact, nonfocal.  CBC:    Component Value Date/Time   WBC 16.7* 05/07/2011 0530   HGB 12.3 05/07/2011 0530   HCT 36.2 05/07/2011 0530   PLT 342 05/07/2011 0530   MCV 87.0 05/07/2011 0530   NEUTROABS 6.5 05/05/2011 1836   LYMPHSABS 2.5 05/05/2011 1836   MONOABS 0.2 05/05/2011 1836   EOSABS 0.2 05/05/2011 1836   BASOSABS 0.1 05/05/2011 1836    Basic Metabolic Panel:    Component Value Date/Time   NA 140 05/07/2011 0530   K 3.5 05/07/2011 0530   CL 103 05/07/2011 0530   CO2 26 05/07/2011 0530   BUN 23 05/07/2011 0530   CREATININE 1.06 05/07/2011 0530   GLUCOSE 115* 05/07/2011 0530   CALCIUM 9.3 05/07/2011 0530    Hospital Course:    COPD with acute exacerbation  - pt has responded well to treatment with nebulizers, antibiotics, and oxygen - she had no wheezing on physical  exam on discharge date - she has maintained oxygen saturations > 95% on RA - she will continue to take antibiotic upon discharge for additional 10 days  Active Problems:  DYSLIPIDEMIA  - continue tricor   HYPERTENSION  - continue home medication   Pulmonary nodules  - 11 mm spiculated lesion in the right upper lobe worrisome for small neoplasm. Recommend PET CT for further evaluation. There is also a 7.5 mm nodule in the right middle lobe which requires followup.   Diastolic CHF, chronic  - grade I diastolic disfunction with slight elevation in BNP  - obtained 2 D ECHO and was unremarkable for acute events with EF 60 - 65% - cycled cardiac enzymes and all 3 sets are negative - TSH, A1C are within normal limits - continue tricor  - lasix 20 mg BID PO   Community acquired pneumonia  - continue azithromycin  ANXIETY AND DEPRESSION  - continue geodon as per patient's home regimen BID   Time spent on Discharge: Over 30 minutes  Signed: Debbora Presto 05/07/2011, 2:08 PM  [facility], pager #: [phone] Main office number: [phone]

## 2011-05-15 ENCOUNTER — Other Ambulatory Visit (HOSPITAL_COMMUNITY): Payer: Self-pay | Admitting: Internal Medicine

## 2011-05-15 DIAGNOSIS — R911 Solitary pulmonary nodule: Secondary | ICD-10-CM

## 2011-05-16 ENCOUNTER — Encounter (HOSPITAL_COMMUNITY): Payer: Self-pay

## 2011-05-16 ENCOUNTER — Encounter (HOSPITAL_COMMUNITY)
Admit: 2011-05-16 | Discharge: 2011-05-16 | Disposition: A | Discharge status: Home / Self Care | Payer: PRIVATE HEALTH INSURANCE | Attending: Internal Medicine | Admitting: Internal Medicine

## 2011-05-16 DIAGNOSIS — I700 Atherosclerosis of aorta: Secondary | ICD-10-CM | POA: Insufficient documentation

## 2011-05-16 DIAGNOSIS — R911 Solitary pulmonary nodule: Secondary | ICD-10-CM | POA: Insufficient documentation

## 2011-05-16 LAB — GLUCOSE, CAPILLARY: Glucose-Capillary: 86 mg/dL (ref 70–99)

## 2011-05-16 MED ORDER — FLUDEOXYGLUCOSE F - 18 (FDG) INJECTION
17.6000 | Freq: Once | INTRAVENOUS | Status: AC | PRN
  Administered 2011-05-16: 17.6 via INTRAVENOUS

## 2011-05-17 ENCOUNTER — Telehealth: Payer: Self-pay | Admitting: *Deleted

## 2011-05-17 NOTE — Telephone Encounter (Signed)
Because patient has never been seen by a pulm physician in the hosp or office, unable to see TP (she does not see new patients).  LMOM TCB x1 to reschedule pt's appt.  Told her to ask for me by name.  Will call again Monday morning.

## 2011-05-20 ENCOUNTER — Inpatient Hospital Stay: Payer: PRIVATE HEALTH INSURANCE | Admitting: Adult Health

## 2011-05-20 NOTE — Telephone Encounter (Signed)
LM again for pt to call regarding her appt with TP this afternoon.

## 2011-05-20 NOTE — Telephone Encounter (Signed)
appt rescheduled to 4.9.13 @ 2:30p with Dr Sherene Sires.

## 2011-06-04 ENCOUNTER — Inpatient Hospital Stay: Payer: PRIVATE HEALTH INSURANCE | Admitting: Internal Medicine

## 2011-07-26 ENCOUNTER — Emergency Department (HOSPITAL_COMMUNITY)
Admission: EM | Admit: 2011-07-26 | Discharge: 2011-07-27 | Disposition: A | Discharge status: Other Acute Inpatient Hospital | Payer: 59 | Source: Home / Self Care | Attending: Emergency Medicine | Admitting: Emergency Medicine

## 2011-07-26 ENCOUNTER — Encounter (HOSPITAL_COMMUNITY): Payer: Self-pay | Admitting: Family Medicine

## 2011-07-26 DIAGNOSIS — R45851 Suicidal ideations: Secondary | ICD-10-CM

## 2011-07-26 DIAGNOSIS — F319 Bipolar disorder, unspecified: Secondary | ICD-10-CM | POA: Insufficient documentation

## 2011-07-26 DIAGNOSIS — Z8739 Personal history of other diseases of the musculoskeletal system and connective tissue: Secondary | ICD-10-CM | POA: Insufficient documentation

## 2011-07-26 DIAGNOSIS — Z79899 Other long term (current) drug therapy: Secondary | ICD-10-CM | POA: Insufficient documentation

## 2011-07-26 DIAGNOSIS — I10 Essential (primary) hypertension: Secondary | ICD-10-CM | POA: Insufficient documentation

## 2011-07-26 DIAGNOSIS — F329 Major depressive disorder, single episode, unspecified: Secondary | ICD-10-CM

## 2011-07-26 LAB — URINALYSIS, ROUTINE W REFLEX MICROSCOPIC
Glucose, UA: NEGATIVE mg/dL
Hgb urine dipstick: NEGATIVE
Ketones, ur: NEGATIVE mg/dL
Leukocytes, UA: NEGATIVE
Protein, ur: NEGATIVE mg/dL
pH: 7 (ref 5.0–8.0)

## 2011-07-26 LAB — BASIC METABOLIC PANEL
CO2: 24 mEq/L (ref 19–32)
Calcium: 9.6 mg/dL (ref 8.4–10.5)
Chloride: 100 mEq/L (ref 96–112)
Creatinine, Ser: 0.83 mg/dL (ref 0.50–1.10)
Glucose, Bld: 78 mg/dL (ref 70–99)

## 2011-07-26 LAB — DIFFERENTIAL
Eosinophils Relative: 4 % (ref 0–5)
Lymphocytes Relative: 45 % (ref 12–46)
Lymphs Abs: 4.8 10*3/uL — ABNORMAL HIGH (ref 0.7–4.0)
Monocytes Absolute: 0.6 10*3/uL (ref 0.1–1.0)
Monocytes Relative: 6 % (ref 3–12)
Neutro Abs: 4.7 10*3/uL (ref 1.7–7.7)

## 2011-07-26 LAB — CBC
HCT: 42.3 % (ref 36.0–46.0)
Hemoglobin: 14.5 g/dL (ref 12.0–15.0)
MCV: 87.6 fL (ref 78.0–100.0)
RBC: 4.83 MIL/uL (ref 3.87–5.11)
WBC: 10.7 10*3/uL — ABNORMAL HIGH (ref 4.0–10.5)

## 2011-07-26 LAB — RAPID URINE DRUG SCREEN, HOSP PERFORMED
Amphetamines: NOT DETECTED
Benzodiazepines: NOT DETECTED
Cocaine: POSITIVE — AB

## 2011-07-26 LAB — ETHANOL: Alcohol, Ethyl (B): <11 mg/dL (ref 0–11)

## 2011-07-26 MED ORDER — IPRATROPIUM-ALBUTEROL 0.5-2.5 (3) MG/3ML IN SOLN: 3.0000 mL | RESPIRATORY_TRACT | Status: DC | PRN

## 2011-07-26 MED ORDER — ALBUTEROL SULFATE (5 MG/ML) 0.5% IN NEBU: 2.5000 mg | INHALATION_SOLUTION | RESPIRATORY_TRACT | Status: DC | PRN

## 2011-07-26 MED ORDER — OXYCODONE-ACETAMINOPHEN 5-325 MG PO TABS
1.0000 | ORAL_TABLET | Freq: Once | ORAL | Status: AC
  Administered 2011-07-26: 1 via ORAL
  Filled 2011-07-26: qty 1

## 2011-07-26 MED ORDER — HYDROXYZINE HCL 25 MG PO TABS
50.0000 mg | ORAL_TABLET | Freq: Every day | ORAL | Status: DC
  Administered 2011-07-26: 50 mg via ORAL
  Filled 2011-07-26: qty 2

## 2011-07-26 MED ORDER — OXYCODONE HCL 10 MG PO TB12: 20.0000 mg | ORAL_TABLET | Freq: Two times a day (BID) | ORAL | Status: DC | PRN

## 2011-07-26 MED ORDER — ASPIRIN 325 MG PO TABS
325.0000 mg | ORAL_TABLET | Freq: Every day | ORAL | Status: DC
Start: 2011-07-26 — End: 2011-07-27
  Administered 2011-07-26: 325 mg via ORAL
  Filled 2011-07-26: qty 1

## 2011-07-26 MED ORDER — ACETAMINOPHEN 325 MG PO TABS: 650.0000 mg | ORAL_TABLET | ORAL | Status: DC | PRN

## 2011-07-26 MED ORDER — TRIAMTERENE-HCTZ 37.5-25 MG PO TABS
1.0000 | ORAL_TABLET | Freq: Every day | ORAL | Status: DC
  Filled 2011-07-26 (×2): qty 1

## 2011-07-26 MED ORDER — POTASSIUM CHLORIDE CRYS ER 20 MEQ PO TBCR
20.0000 meq | EXTENDED_RELEASE_TABLET | Freq: Every day | ORAL | Status: DC
  Administered 2011-07-26: 20 meq via ORAL
  Filled 2011-07-26: qty 1

## 2011-07-26 MED ORDER — ATENOLOL 100 MG PO TABS
100.0000 mg | ORAL_TABLET | Freq: Every day | ORAL | Status: DC
  Administered 2011-07-26: 100 mg via ORAL
  Filled 2011-07-26 (×2): qty 1

## 2011-07-26 MED ORDER — IPRATROPIUM BROMIDE 0.02 % IN SOLN: 0.5000 mg | RESPIRATORY_TRACT | Status: DC | PRN

## 2011-07-26 MED ORDER — ONDANSETRON HCL 4 MG PO TABS: 4.0000 mg | ORAL_TABLET | Freq: Three times a day (TID) | ORAL | Status: DC | PRN

## 2011-07-26 MED ORDER — POTASSIUM CHLORIDE 20 MEQ PO PACK: 20.0000 meq | PACK | Freq: Every day | ORAL | Status: DC

## 2011-07-26 MED ORDER — IBUPROFEN 600 MG PO TABS: 600.0000 mg | ORAL_TABLET | Freq: Three times a day (TID) | ORAL | Status: DC | PRN

## 2011-07-26 NOTE — ED Notes (Signed)
[id number]<AV> Expected date:<BR> Expected time: 5:21 PM<BR> Means of arrival:<BR> Comments:<BR> M11 - 53yoF seizures x2 with hx

## 2011-07-26 NOTE — ED Notes (Signed)
Dr. Plunkett at bedside.  

## 2011-07-26 NOTE — ED Notes (Signed)
Phlebotomy at bedside.

## 2011-07-26 NOTE — ED Notes (Signed)
Pt given [place] sandwich for meal.

## 2011-07-26 NOTE — ED Notes (Signed)
Presents from Main ED, complaint of being suicidal & homicidal.  Pt eval by home health nurse today 7 became agitated & combative.  Pt calm & cooperative at present.  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

## 2011-07-26 NOTE — ED Notes (Signed)
Pt changed into blue scrubs and wanded by security.  1 bag of belongings labeled and placed at nurses station.  Attempted to call [facility] to notify of suicide precautions, unable to get an answer. Will attempt again at later time.
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
## 2011-07-26 NOTE — ED Notes (Signed)
Per EMS: Pt was being seen by home health nurse and became very agitated and combative. Reports thoughts of harming self and others. States she is supposed to be taking geodon but has been off it for 7 months, stating it doesn't work.

## 2011-07-26 NOTE — ED Notes (Signed)
Sitter at bedside. NAD noted at this time.

## 2011-07-27 ENCOUNTER — Inpatient Hospital Stay (HOSPITAL_COMMUNITY)
Admission: AD | Admit: 2011-07-27 | Discharge: 2011-07-30 | DRG: 885 | Disposition: A | Discharge status: Home / Self Care | Payer: 59 | Source: Other Acute Inpatient Hospital | Attending: Psychiatry | Admitting: Psychiatry

## 2011-07-27 ENCOUNTER — Encounter (HOSPITAL_COMMUNITY): Payer: Self-pay | Admitting: Emergency Medicine

## 2011-07-27 DIAGNOSIS — J45909 Unspecified asthma, uncomplicated: Secondary | ICD-10-CM | POA: Diagnosis present

## 2011-07-27 DIAGNOSIS — R918 Other nonspecific abnormal finding of lung field: Secondary | ICD-10-CM

## 2011-07-27 DIAGNOSIS — I10 Essential (primary) hypertension: Secondary | ICD-10-CM | POA: Diagnosis present

## 2011-07-27 DIAGNOSIS — F411 Generalized anxiety disorder: Secondary | ICD-10-CM | POA: Diagnosis present

## 2011-07-27 DIAGNOSIS — F419 Anxiety disorder, unspecified: Secondary | ICD-10-CM

## 2011-07-27 DIAGNOSIS — Z79899 Other long term (current) drug therapy: Secondary | ICD-10-CM

## 2011-07-27 DIAGNOSIS — IMO0001 Reserved for inherently not codable concepts without codable children: Secondary | ICD-10-CM | POA: Diagnosis present

## 2011-07-27 DIAGNOSIS — Z7982 Long term (current) use of aspirin: Secondary | ICD-10-CM

## 2011-07-27 DIAGNOSIS — F250 Schizoaffective disorder, bipolar type: Secondary | ICD-10-CM | POA: Diagnosis present

## 2011-07-27 DIAGNOSIS — F331 Major depressive disorder, recurrent, moderate: Secondary | ICD-10-CM

## 2011-07-27 DIAGNOSIS — M1990 Unspecified osteoarthritis, unspecified site: Secondary | ICD-10-CM | POA: Diagnosis present

## 2011-07-27 DIAGNOSIS — Z91199 Patient's noncompliance with other medical treatment and regimen due to unspecified reason: Secondary | ICD-10-CM

## 2011-07-27 DIAGNOSIS — F200 Paranoid schizophrenia: Principal | ICD-10-CM | POA: Diagnosis present

## 2011-07-27 DIAGNOSIS — F1410 Cocaine abuse, uncomplicated: Secondary | ICD-10-CM | POA: Diagnosis present

## 2011-07-27 DIAGNOSIS — E785 Hyperlipidemia, unspecified: Secondary | ICD-10-CM | POA: Diagnosis present

## 2011-07-27 DIAGNOSIS — J441 Chronic obstructive pulmonary disease with (acute) exacerbation: Secondary | ICD-10-CM

## 2011-07-27 DIAGNOSIS — Z9119 Patient's noncompliance with other medical treatment and regimen: Secondary | ICD-10-CM

## 2011-07-27 DIAGNOSIS — I5032 Chronic diastolic (congestive) heart failure: Secondary | ICD-10-CM

## 2011-07-27 MED ORDER — QUETIAPINE FUMARATE 50 MG PO TABS: 50.0000 mg | ORAL_TABLET | ORAL | Status: DC | PRN

## 2011-07-27 MED ORDER — ASPIRIN 325 MG PO TABS
325.0000 mg | ORAL_TABLET | Freq: Every day | ORAL | Status: DC
  Administered 2011-07-27 – 2011-07-30 (×4): 325 mg via ORAL
  Filled 2011-07-27 (×7): qty 1

## 2011-07-27 MED ORDER — ATENOLOL 100 MG PO TABS
100.0000 mg | ORAL_TABLET | Freq: Every day | ORAL | Status: DC
  Administered 2011-07-27 – 2011-07-29 (×3): 100 mg via ORAL
  Filled 2011-07-27: qty 2
  Filled 2011-07-27 (×4): qty 1

## 2011-07-27 MED ORDER — ALBUTEROL SULFATE (5 MG/ML) 0.5% IN NEBU: 2.5000 mg | INHALATION_SOLUTION | RESPIRATORY_TRACT | Status: DC | PRN

## 2011-07-27 MED ORDER — MAGNESIUM HYDROXIDE 400 MG/5ML PO SUSP: 30.0000 mL | Freq: Every day | ORAL | Status: DC | PRN

## 2011-07-27 MED ORDER — IPRATROPIUM-ALBUTEROL 0.5-2.5 (3) MG/3ML IN SOLN: 3.0000 mL | RESPIRATORY_TRACT | Status: DC | PRN

## 2011-07-27 MED ORDER — ALUM & MAG HYDROXIDE-SIMETH 200-200-20 MG/5ML PO SUSP: 30.0000 mL | ORAL | Status: DC | PRN

## 2011-07-27 MED ORDER — IPRATROPIUM BROMIDE 0.02 % IN SOLN: 0.5000 mg | RESPIRATORY_TRACT | Status: DC | PRN

## 2011-07-27 MED ORDER — B COMPLEX-C PO TABS
1.0000 | ORAL_TABLET | Freq: Every day | ORAL | Status: DC
  Administered 2011-07-27 – 2011-07-30 (×4): 1 via ORAL
  Filled 2011-07-27 (×6): qty 1

## 2011-07-27 MED ORDER — QUETIAPINE FUMARATE 50 MG PO TABS
250.0000 mg | ORAL_TABLET | Freq: Every day | ORAL | Status: DC
  Administered 2011-07-27 – 2011-07-28 (×2): 250 mg via ORAL
  Filled 2011-07-27 (×5): qty 1

## 2011-07-27 MED ORDER — ACETAMINOPHEN 325 MG PO TABS
650.0000 mg | ORAL_TABLET | Freq: Four times a day (QID) | ORAL | Status: DC | PRN
  Administered 2011-07-29 – 2011-07-30 (×2): 650 mg via ORAL

## 2011-07-27 MED ORDER — TRIAMCINOLONE 0.1 % CREAM:EUCERIN CREAM 1:1
TOPICAL_CREAM | Freq: Three times a day (TID) | CUTANEOUS | Status: DC | PRN
  Filled 2011-07-27: qty 1

## 2011-07-27 MED ORDER — PNEUMOCOCCAL VAC POLYVALENT 25 MCG/0.5ML IJ INJ
0.5000 mL | INJECTION | Freq: Once | INTRAMUSCULAR | Status: AC
  Administered 2011-07-27: 0.5 mL via INTRAMUSCULAR

## 2011-07-27 MED ORDER — B COMPLEX PO TABS: 1.0000 | ORAL_TABLET | Freq: Every day | ORAL | Status: DC

## 2011-07-27 MED ORDER — TRIAMTERENE-HCTZ 37.5-25 MG PO CAPS
1.0000 | ORAL_CAPSULE | ORAL | Status: DC
  Administered 2011-07-28: 1 via ORAL
  Filled 2011-07-27 (×3): qty 1

## 2011-07-27 MED ORDER — CHLORHEXIDINE GLUCONATE 4 % EX LIQD
Freq: Every day | CUTANEOUS | Status: DC | PRN
  Filled 2011-07-27: qty 15

## 2011-07-27 NOTE — H&P (Signed)
  Pt was seen by me today and I agree with the key elements documented in H&P.  

## 2011-07-27 NOTE — Progress Notes (Signed)
Patient ID: AMARANTA MEHL, female   DOB: 30-Jan-1951, 61 y.o.   MRN: [id number] 07-27-11 nursing shift note: pt has been very pleasant cooperative on the 400 unit. She did make it clear that she did not want to take any geodon. She stated she slept fair, appetite improving, energy normal, attention improving. Her hopelessness is at 5. No w/d symptoms. She is having thoughts of suicide but was able to contract. In the last 24 hrs she has had a headache. She has to see once she gets home to make any changes. She had question about her meds and who she would be seeing. rn answered those concerns for her.  The only problems she sees staying on her meds is if it is geodon. rn will monitor and q 15 minutes continue.

## 2011-07-27 NOTE — Progress Notes (Signed)
Patient ID: Wanda Brown, female   DOB: 04-Jan-1951, 61 y.o.   MRN: [id number] Pt admitted to [facility] for HI/SI as reported by her home health nurse. Pt became angry when home health RN asked about her family. Pt states she felt enraged and began to "sweat and get angry when I thought about my momma". Pt becoming angry during admission as well. Pt states she was raised by her grandmother and thought her mom was her sister while she was growing up but then found out later that she was really her mother. Pt states she has several other siblings that were all raised knowing who their mom was. Pt states she is treated differently by her mother and has a lot of animosity towards her. She states her mother expects money from her but not her siblings and feels that she is the only one who is expected to run errands for her. Pt states when she visits her mother she is made to sleep on the couch, but her brothers and sisters sleep in the extra bedrooms.   Pt is also upset stating that her food stamps and section 8 housing benefits were cut without cause and it is causing a lot of stress in her life now. Pt requests help of a Child psychotherapist while here at [facility].   Pt very cooperative and apologetic for getting so upset. Pt states she is embarrassed that she gets so angry but that she cannot help it. Pt states "I ain't taking my Geodon no more; I haven't had it for months because it makes me feel worse and it doesn't help". Pt states "I tried cocaine and pot but it just ain't for me".   Pt oriented to unit and room and states an understanding of rules. Pt denies SI/HI at this time; no s/s of distress noted at this time.

## 2011-07-27 NOTE — BH Assessment (Signed)
Pt has been hypertensive this evening with complaints of a moderate headache of 5. This pt states this HA she is experiencing is not abnormal for her. Md notified and will review this pt chart in the a.m for any further neccessary intervention for this pt. Pt informed of MD response. Pt this evening has been calm and cooperative . She denies any A/V hallucinations. She reports " feeling good" this evening. Pt has been pleasant with her interactions among staff and patients this evening. Pt remains safe at this time with q38min checks.

## 2011-07-27 NOTE — Progress Notes (Signed)
Patient ID: NASTASSJA WITKOP, female   DOB: Dec 19, 1950, 61 y.o.   MRN: [id number]  [facility] Group Notes:  (Counselor/Nursing/MHT/Case Management/Adjunct)  07/27/2011 11 AM  Type of Therapy:  Aftercare Planning, Group Therapy, Dance/Movement Therapy   Participation Level:  Minimal  Participation Quality:  Appropriate  Affect:  Appropriate  Cognitive:  Appropriate  Insight:  Limited  Engagement in Group:  Limited  Engagement in Therapy:  Limited  Modes of Intervention:  Clarification, Problem-solving, Role-play, Socialization and Support  Summary of Progress/Problems: After Care: Pt. attended and participated in aftercare planning group. Pt. accepted information on suicide prevention, warning signs to look for with suicide and crisis line numbers to use. The pt. agreed to call crisis line numbers if having warning signs or having thoughts of suicide. Pt. listed their current anxiety and depression level as felling the color bluish red.  Counseling:  Therapist discussed people,place or things that can cause negative reactions in our lives. Therapist asked group what are healthy coping mechanisms to utilize when feeling stuck or frustrated.  Pt. stated "to walk away and cool down". Pt. seems aware when to redirect her thoughts in a stressful situation.   Rhunette Croft

## 2011-07-27 NOTE — Tx Team (Signed)
Initial Interdisciplinary Treatment Plan  PATIENT STRENGTHS: (choose at least two) Ability for insight Average or above average intelligence Capable of independent living Communication skills Motivation for treatment/growth  PATIENT STRESSORS: Financial difficulties Marital or family conflict Medication change or noncompliance Substance abuse   PROBLEM LIST: Problem List/Patient Goals Date to be addressed Date deferred Reason deferred Estimated date of resolution  Depression 07/27/11     Anger 07/27/11     Substance abuse 07/27/11                                          DISCHARGE CRITERIA:  Ability to meet basic life and health needs Adequate post-discharge living arrangements Improved stabilization in mood, thinking, and/or behavior Motivation to continue treatment in a less acute level of care Need for constant or close observation no longer present Verbal commitment to aftercare and medication compliance  PRELIMINARY DISCHARGE PLAN: Attend aftercare/continuing care group Outpatient therapy Return to previous living arrangement  PATIENT/FAMIILY INVOLVEMENT: This treatment plan has been presented to and reviewed with the patient, Wanda Brown, and/or family member, .  The patient and family have been given the opportunity to ask questions and make suggestions.  Wanda Brown 07/27/2011, 4:04 AM

## 2011-07-27 NOTE — [facility] Suicide Risk Assessment (Signed)
Suicide Risk Assessment  Admission Assessment     Demographic factors:  Assessment Details Time of Assessment: Admission Information Obtained From: Patient Current Mental Status:   see below Loss Factors:  Loss Factors: Financial problems / change in socioeconomic status Historical Factors:   1 SI attempt Risk Reduction Factors:  Risk Reduction Factors: Sense of responsibility to family  CLINICAL FACTORS:   Alcohol/Substance Abuse/Dependencies Schizophrenia:   Paranoid or undifferentiated type  COGNITIVE FEATURES THAT CONTRIBUTE TO RISK:  Loss of executive function    SUICIDE RISK:   Mild:  Suicidal ideation of limited frequency, intensity, duration, and specificity.  There are no identifiable plans, no associated intent, mild dysphoria and related symptoms, good self-control (both objective and subjective assessment), few other risk factors, and identifiable protective factors, including available and accessible social support.  PLAN OF CARE: Mental Status Examination/Evaluation:  Objective: Appearance: Casual poor dentition ages her   Psychomotor Activity: Scratching self -recent bedbug exposure   Eye Contact:: Good   Speech: Clear and Coherent and Normal Rate   Volume: Normal   Mood: Irritable   Affect: Full Range   Thought Process: Clear rational goal oriented - get on med that works.   Orientation: Full   Thought Content:has AH command in nature   Suicidal Thoughts: No   Homicidal Thoughts: No   Judgement: Poor   Insight: Lacking   DIAGNOSIS:  AXIS I  Chronic Paranoid Schizophrenia, cocaine abuse  AXIS II  Deferred   AXIS III  See medical history.   AXIS IV  Non compliance and cocaine   AXIS V  11-20 some danger of hurting self or others possible OR occasionally fails to maintain minimal personal hygiene OR gross impairment in communication   Treatment Plan Summary:  Admit for safety and stabiization  Stop Geodon start Seroquel  Order Triamcinolone cream      Wonda Cerise 07/27/2011, 7:51 PM
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
## 2011-07-28 MED ORDER — TRIAMTERENE-HCTZ 37.5-25 MG PO TABS
1.0000 | ORAL_TABLET | ORAL | Status: DC
  Administered 2011-07-29 – 2011-07-30 (×2): 1 via ORAL
  Filled 2011-07-28 (×3): qty 1

## 2011-07-28 MED ORDER — DIPHENHYDRAMINE-ZINC ACETATE 2-0.1 % EX CREA: TOPICAL_CREAM | Freq: Three times a day (TID) | CUTANEOUS | Status: DC | PRN

## 2011-07-28 MED ORDER — CHLORHEXIDINE GLUCONATE 4 % EX LIQD
Freq: Every day | CUTANEOUS | Status: DC | PRN
  Filled 2011-07-28: qty 15

## 2011-07-28 NOTE — Progress Notes (Signed)
Patient ID: Wanda Brown, female   DOB: 07-30-1950, 61 y.o.   MRN: [id number]  Thinks her AVH (more AH) are less intense now. Was able to sleep better last night but still poor. In better mood today. Itching is getting better too.    Mental Status Examination/Evaluation:  Objective: Appearance: Casual poor dentition ages her   Psychomotor Activity: Scratching self -recent bedbug exposure   Eye Contact:: Good   Speech: Clear and Coherent and Normal Rate   Volume: Normal   Mood: ok  Affect: ristricted  Thought Process: Clear rational goal oriented - get on med that works.   Orientation: Full   Thought Content:has AH command in nature   Suicidal Thoughts: No   Homicidal Thoughts: No   Judgement: Poor   Insight: Lacking   DIAGNOSIS:  AXIS I  Chronic Paranoid Schizophrenia, cocaine abuse   AXIS II  Deferred   AXIS III  See medical history.   AXIS IV  Non compliance and cocaine   AXIS V  11-20 some danger of hurting self or others possible OR occasionally fails to maintain minimal personal hygiene OR gross impairment in communication   Treatment Plan Summary:    Continue current meds Will add prn benadryl for itching

## 2011-07-28 NOTE — Progress Notes (Signed)
Patient ID: Wanda Brown, female   DOB: 1950/06/25, 61 y.o.   MRN: [id number] 07-28-11 nursing shift note: pt came to the med window today after she eat her breakfast. She was very pleasant and engaged in conversation. On her inventory she wrote slept fair, appetite improving, energy high, attention good with depression at 3 and hopelessness at 1. No w/d symptoms. No suicide ideation and a physical problem of a headache and blurred vision in the last 24 hours.  She rated her pain at 6 but was unable to specify any details regarding the pain. When she goes home she plans "live her life to the fullest". She commends the staff. She sees no problems staying on her meds after discharge if " they are the right type of medication".  rn will monitor and q 15 min continue.

## 2011-07-28 NOTE — Progress Notes (Signed)
[facility] Adult Inpatient Family/Significant Other Suicide Prevention Education  Suicide Prevention Education:  Education Completed; Wanda Brown in [phone] in law- has been identified by the patient as the family member/significant other with whom the patient will be residing, and identified as the person(s) who will aid the patient in the event of a mental health crisis (suicidal ideations/suicide attempt).  With written consent from the patient, the family member/significant other has been provided the following suicide prevention education, prior to the and/or following the discharge of the patient.  The suicide prevention education provided includes the following:  Suicide risk factors  Suicide prevention and interventions  National Suicide Hotline telephone number  [facility] assessment telephone number  [facility] Emergency Assistance 911  [facility] and/or Residential Mobile Crisis Unit telephone number  Request made of family/significant other to:  Remove weapons (e.g., guns, rifles, knives), all items previously/currently identified as safety concern.  Pt.'s sister in law states the pt. Has no guns or weapons.  Remove drugs/medications (over-the-counter, prescriptions, illicit drugs), all items previously/currently identified as a safety concern. Pt.'s sister in law will secure the pt.'s apartment and make sure it is safe.    Pt.'s sister in law states that the pt. Lives alone. Pt.'s sister in states the pt. was just at her home in [facility]  Last weekend and seemed fine. Pt.'s sister in law states that she knows of no past SI or HI attempts or throughts by the pt. Prior to the pt. Coming to [facility]. Pt. Does not know of any past drug use by the pt. Pt.'s sister in law was asked about pt.'s medication and stated that she thought the pt. was taking it. Pt.'s sister in law can be reached at the number above.  The family member/significant  other verbalizes understanding of the suicide prevention education information provided.  The family member/significant other agrees to remove the items of safety concern listed above.  Wanda Brown 07/28/2011, 4:30 PM

## 2011-07-28 NOTE — Progress Notes (Signed)
Patient ID: Wanda Brown, female   DOB: 10/22/1950, 61 y.o.   MRN: [id number]  [facility] Group Notes:  (Counselor/Nursing/MHT/Case Management/Adjunct)  07/28/2011 11 AM  Type of Therapy:  Aftercare Planning, Group Therapy, Dance/Movement Therapy   Participation Level:  Active  Participation Quality:  Appropriate  Affect:  Appropriate  Cognitive:  Appropriate  Insight:  Limited  Engagement in Group:  Good  Engagement in Therapy:  Good  Modes of Intervention:  Clarification, Problem-solving, Role-play, Socialization and Support  Summary of Progress/Problems: After Care: Pt. attended and participated in aftercare planning group. Pt. accepted information on suicide prevention, warning signs to look for with suicide and crisis line numbers to use. The pt. agreed to call crisis line numbers if having warning signs or having thoughts of suicide. Pt. listed their current mood as feeling good.  Counseling:  Therapist discussed ways supports can be helpful or not so helpful. Therapist demonstrated three types of supports.  Therapist asked group which support they most identify.  Therapist invited group to stand in a circle in order to envision what support can look like.  Pt. stated that  "Supports in my life are different support groups that are available in the community".   Rhunette Croft

## 2011-07-28 NOTE — Progress Notes (Signed)
Patient ID: Wanda Brown, female   DOB: January 28, 1951, 61 y.o.   MRN: [id number] Pt pleasant and cooperative and smiling upon assessment. Pt states she feels much better about the situation with her Mom and "I just need to worry about myself; I don't need the stress of them anymore. I'm gonna be all right". Pt denies SI/HI. No s/s of distress at this time.

## 2011-07-29 DIAGNOSIS — F1410 Cocaine abuse, uncomplicated: Secondary | ICD-10-CM | POA: Diagnosis present

## 2011-07-29 DIAGNOSIS — F259 Schizoaffective disorder, unspecified: Secondary | ICD-10-CM

## 2011-07-29 DIAGNOSIS — F250 Schizoaffective disorder, bipolar type: Secondary | ICD-10-CM | POA: Diagnosis present

## 2011-07-29 MED ORDER — QUETIAPINE FUMARATE 300 MG PO TABS
300.0000 mg | ORAL_TABLET | Freq: Every day | ORAL | Status: DC
  Administered 2011-07-29: 300 mg via ORAL
  Filled 2011-07-29: qty 14
  Filled 2011-07-29 (×3): qty 1

## 2011-07-29 NOTE — Progress Notes (Signed)
[facility] MD Progress Note  07/29/2011 5:29 PM  Diagnosis:  Axis I: Schizoaffective Disorder - Bipolar Type.  Cocaine Abuse.  The patient was seen today and reports the following:   ADL's: Intact.  Sleep: The patient reports to sleeping well last night.  Appetite: The patient reports her appetite is improving.   Mild>(1-10) >Severe  Hopelessness (1-10): 0  Depression (1-10): 2-3  Anxiety (1-10): 4   Suicidal Ideation: The patient adamantly denies any suicidal ideations today.  Plan: No  Intent: No.  Means: No   Homicidal Ideation: The patient adamantly denies any homicidal ideations today.  Plan: No  Intent: No.  Means: No   General Appearance/Behavior: The patient was friendly and cooperative today with this provider.  Eye Contact: Good.  Speech: Appropriate in rate and volume with no pressuring noted today.  Motor Behavior: wnl.  Level of Consciousness: Alert and Oriented x 3.  Mental Status: Alert and Oriented x 3.  Mood: Mildly Depressed.  Affect: Slightly Constricted.  Anxiety Level: Mild to moderate anxiety reported today.  Thought Process: wnl.  Thought Content: The patient denies any current auditory or visual hallucination but states prior to admission she has heard voices in the past.  She also denies any delusional thinking today.  Perception: wnl.  Judgment: Good.  Insight: Good.  Cognition: Oriented to person, place and time.  Sleep:  Number of Hours: 5.5    Vital Signs:Blood pressure 111/65, pulse 60, temperature 98.3 F (36.8 C), temperature source Oral, resp. rate 20, height 5\' 5"  (1.651 m), weight 75.751 kg (167 lb).  Current Medications: Current Facility-Administered Medications  Medication Dose Route Frequency Provider Last Rate Last Dose  . acetaminophen (TYLENOL) tablet 650 mg  650 mg Oral Q6H PRN Curlene Labrum Naliya Gish, MD      . albuterol (PROVENTIL) (5 MG/ML) 0.5% nebulizer solution 2.5 mg  2.5 mg Nebulization PRN Curlene Labrum Candi Profit, MD       And  .  ipratropium (ATROVENT) nebulizer solution 0.5 mg  0.5 mg Nebulization PRN Curlene Labrum Naythen Heikkila, MD      . alum & mag hydroxide-simeth (MAALOX/MYLANTA) 200-200-20 MG/5ML suspension 30 mL  30 mL Oral Q4H PRN Curlene Labrum Maleigha Colvard, MD      . aspirin tablet 325 mg  325 mg Oral Daily Curlene Labrum Joplin Canty, MD   325 mg at 07/29/11 0804  . atenolol (TENORMIN) tablet 100 mg  100 mg Oral QHS Curlene Labrum Mariaeduarda Defranco, MD   100 mg at 07/28/11 2200  . B-complex with vitamin C tablet 1 tablet  1 tablet Oral Daily Ronny Bacon, MD   1 tablet at 07/29/11 0804  . chlorhexidine (HIBICLENS) 4 % liquid   Topical Daily PRN Curlene Labrum Ronesha Heenan, MD      . diphenhydrAMINE-zinc acetate (BENADRYL) 2-0.1 % cream   Topical TID PRN Wonda Cerise, MD      . magnesium hydroxide (MILK OF MAGNESIA) suspension 30 mL  30 mL Oral Daily PRN Curlene Labrum Yosef Krogh, MD      . QUEtiapine (SEROQUEL) tablet 300 mg  300 mg Oral QHS Xiomara Sevillano D Dina Mobley, MD      . QUEtiapine (SEROQUEL) tablet 50 mg  50 mg Oral Q4H PRN Mickie D. Adams, PA      . triamcinolone 0.1 % cream : eucerin cream, 1:1   Topical TID PRN Mickie D. Adams, PA      . triamterene-hydrochlorothiazide (MAXZIDE-25) 37.5-25 MG per tablet 1 each  1 each Oral BH-q7a Ronny Bacon, MD  1 each at 07/29/11 0626  . DISCONTD: QUEtiapine (SEROQUEL) tablet 250 mg  250 mg Oral QHS Mickie D. Adams, PA   250 mg at 07/28/11 2200   Lab Results: No results found for this or any previous visit (from the past 48 hour(s)).  Review of Systems:  Neurological: The patient denies any headaches today. She denies any seizures or dizziness.  G.I.: The patient denies any constipation today or G.I. upset.  Musculoskeletal: The patient denies any musculoskeletal issues today except back pain.    Time was spent today discussing with the patient her current symptoms. The patient reports that she slept well last night and reports a decreased appetite which is improving. The patient reports mild feelings of sadness, anhedonia and  depressed mood and denies any SI/HI.  She denies any auditory or visual hallucinations or delusional thinking. She does report to having mild to moderate anxiety symptoms today.    The patient reports that prior to admission she was having suicidal ideations as well as depression.  She states this occurred after she was switched from Seroquel to Geodon by her Mental Health Provider.  Treatment Plan Summary:  1. Daily contact with patient to assess and evaluate symptoms and progress in treatment.  2. Medication management  3. The patient will deny suicidal ideations or homicidal ideations for 48 hours prior to discharge and have a depression and anxiety rating of 3 or less. The patient will also deny any auditory or visual hallucinations or delusional thinking.  4. The patient will deny any symptoms of substance withdrawal at time of discharge.   Plan:  1. Will increase the medication Seroquel to 300 mgs po qhs today for sleep, mood stabilization and hallucinations.  2. Will continue the medication Elavil 25 mgs po qhs for sleep and chronic pain.  3. Laboratory studies reviewed.  4. Will order a TSH, Free T3 and Free T4 to evaluate the patient's thyroid functioning.  5. Will continue to monitor.  Janzen Sacks 07/29/2011, 5:29 PM

## 2011-07-29 NOTE — Progress Notes (Signed)
[facility] Group Notes:  (Counselor/Nursing/MHT/Case Management/Adjunct)  07/29/2011 3:59 PM  Type of Therapy:  Group Therapy  Participation Level:  Active  Participation Quality:  Attentive and Sharing  Affect:  Appropriate  Cognitive:  Oriented  Insight:  Good  Engagement in Group:  Good  Engagement in Therapy:  Good  Modes of Intervention:  Clarification, Education, Problem-solving and Support  Summary of Progress/Problems: Patient talked about being disappointed that her outpatient doctor did not listen to her when she said that the Geodon was not working. Feels like her hospitalization could have been avoided. Talked about having stayed out of the hospital since 2005 or 2006. Talked positively about [facility] and how she has learned computer, telephone and cooking skills by going there. Very invested in the program.   Veto Kemps 07/29/2011, 3:59 PM

## 2011-07-29 NOTE — Progress Notes (Signed)
Sitting in the dayroom, watching TV and working on a puzzle. Appears flat and depressed,but does brighten at intervals. Calm and cooperative with assessment. No acute distress noted. States she has had a good day. When asked what qualifies a good day, she replied that the treatment team had gotten her on the right medications and she is well enough to go home. States she feels less depressed and less anxious. Support and encouragement provided. Offered no questions or concerns. Denies SI/HI/AVH and contracts for safety. POC and medications for the shift reviewed and understanding verbalized. Safety has been maintained with Q15 minute observation. Will continue current POC.

## 2011-07-29 NOTE — Progress Notes (Signed)
Patient ID: Wanda Brown, female   DOB: 05-30-1950, 61 y.o.   MRN: [id number] She has been up and to groups, interacting with peers and staff. She denies have any disconfort. Denies hearing voices.

## 2011-07-29 NOTE — Progress Notes (Signed)
Recreation Therapy Notes  07/29/2011         Time: 0930      Group Topic/Focus: The focus of this group is on enhancing patients' problem solving skills, which involves identifying the problem, brainstorming solutions and choosing and trying a solution.   Participation Level: Active  Participation Quality: Attentive  Affect: Appropriate  Cognitive: Alert   Additional Comments: Patient bright, quiet, but nodding in agreement during discussion.   Mozelle Remlinger 07/29/2011 10:06 AM

## 2011-07-29 NOTE — Tx Team (Signed)
Interdisciplinary Treatment Plan Update (Adult)  Date:  07/29/2011  Time Reviewed:  10:15AM-11:15AM  Progress in Treatment: Attending groups:  Yes Participating in groups:    Yes, fully engaged Taking medication as prescribed:    Yes, no refusals Tolerating medication:   Yes, no side effects have been reported by patient or noted by staff Family/Significant other contact made:  Yes, with sister-in-law Patient understands diagnosis:   Yes, good insight, good judgment Discussing patient identified problems/goals with staff:   Yes, fully engaged Medical problems stabilized or resolved:   Yes Denies suicidal/homicidal ideation:  Yes, says it is resolved Issues/concerns per patient self-inventory:   None Other:    New problem(s) identified: No, Describe:    Reason for Continuation of Hospitalization: Anxiety Medication stabilization Other; describe appetite is poor  Interventions implemented related to continuation of hospitalization:  Medication monitoring and adjustment, safety checks Q15 min., suicide risk assessment, group therapy, psychoeducation, collateral contact, aftercare planning, ongoing physician assessments, medication education  Additional comments:  Not applicable  Estimated length of stay:  1 day  Discharge Plan:  Return to her home where she lives alone.  Needs a bus pass to get there.  Follow up at [facility]  Also attends [facility].  New goal(s):  Not applicable  Review of initial/current patient goals per problem list:   1.  Goal(s):  Deny SI / HI for 48 hours prior to D/C.  Met:  No  Target date:  By Discharge   As evidenced by:  Denies SI/HI today, but needs 48 hours to resolve.  2.  Goal(s):  Return sleep to normal pattern of 6+ hours nightly.  Met:  No  Target date:  By Discharge   As evidenced by:  5.5 hours last night  3.  Goal(s):  Reduce auditory hallucinations (with commands) to baseline.  Met:  No  Target date:  By Discharge   As  evidenced by:  Says had them in the past only, needs further observation  4.  Goal(s):  Medication stabilization  Met:  No  Target date:  By Discharge   As evidenced by:  Still stabilizing  5.  Goal(s):  Determine if & how to address any substance abuse issues.  Met:  No  Target date:  By Discharge   As evidenced by:  Research still required  Attendees: Patient:  Wanda Brown  07/29/2011 10:15AM-11:15AM  Family:     Physician:  Dr. Harvie Heck Readling 07/29/2011 10:15AM-11:15AM  Nursing:   Tacy Learn, RN 07/29/2011 10:15AM -11:15AM   Case Manager:  Ambrose Mantle, LCSW 07/29/2011 10:15AM-11:15AM  Counselor:  Veto Kemps, MT-BC 07/29/2011 10:15AM-11:15AM  Other:      Other:      Other:      Other:       Scribe for Treatment Team:   Sarina Ser, 07/29/2011, 10:15AM-11:15AM

## 2011-07-29 NOTE — Discharge Planning (Signed)
Met with patient in Aftercare Planning Group.   She appeared to be sleepy throughout group, but when it was her turn, she opened her eyes and answered all questions without delay.  She reports that her provider is Theodoro Clock at [facility].  She lives alone and will get home by bus, would appreciate a bus pass.  Patient stated that her insurance company sent out [facility] to do an assessment at her home, and that is how she ended up in the behavioral health unit.  She does not believe she needs a home health nurse.  Patient reports that she has no thoughts today of hurting herself or anyone else.  She said she is sleeping fine, although the report during her admission said she was sleeping about 3 hours nightly.  Patient reports no case management needs today.  Ambrose Mantle, LCSW 07/29/2011, 9:48 AM

## 2011-07-30 LAB — TSH: TSH: 0.764 u[IU]/mL (ref 0.350–4.500)

## 2011-07-30 MED ORDER — MULTIVITAMINS PO CAPS: 1.0000 | ORAL_CAPSULE | Freq: Every day | ORAL | Status: DC

## 2011-07-30 MED ORDER — TRIAMTERENE-HCTZ 37.5-25 MG PO CAPS: 1.0000 | ORAL_CAPSULE | ORAL | Status: DC

## 2011-07-30 MED ORDER — QUETIAPINE FUMARATE 300 MG PO TABS: 300.0000 mg | ORAL_TABLET | Freq: Every day | ORAL | Status: DC

## 2011-07-30 MED ORDER — B COMPLEX PO TABS: 1.0000 | ORAL_TABLET | Freq: Every day | ORAL | Status: DC

## 2011-07-30 MED ORDER — ASPIRIN 325 MG PO TABS: 325.0000 mg | ORAL_TABLET | Freq: Every day | ORAL | Status: DC

## 2011-07-30 MED ORDER — ATENOLOL 100 MG PO TABS: 100.0000 mg | ORAL_TABLET | Freq: Every day | ORAL | Status: AC

## 2011-07-30 MED ORDER — IPRATROPIUM-ALBUTEROL 0.5-2.5 (3) MG/3ML IN SOLN: 3.0000 mL | RESPIRATORY_TRACT | Status: AC | PRN

## 2011-07-30 NOTE — Progress Notes (Signed)
[facility] Case Management Discharge Plan:  Will you be returning to the same living situation after discharge: Yes,  lives alone At discharge, do you have transportation home?:Yes,  wants to take a taxi Do you have the ability to pay for your medications:Yes,  insurance and income  Interagency Information:     Release of information consent forms completed and in the chart;  Patient's signature needed at discharge.  Patient to Follow up at:  Follow-up Information    Follow up with Clerance Lav on 08/01/2011. (2:00PM appointment)    Contact information:   [facility]  [address] [place]  [address] Telephone:  [phone]      Follow up with [facility] on 08/01/2011. (9:00AM Return to [facility] program )    Contact information:   [facility] [address] [place]  [address] Telephone:  [phone]         Patient denies SI/HI:   Yes,      Safety Planning and Suicide Prevention discussed:  Yes,  During Aftercare Planning Group, Case Manager provided psychoeducation on "Suicide Prevention Information."  This included descriptions of risk factors for suicide, warning signs that an individual is in crisis and thinking of suicide, and what to do if this occurs.  Pt indicated understanding of information provided, and will read brochure given upon discharge.     Barrier to discharge identified:No.  Summary and Recommendations:  Follow up with [facility] and with [facility].   Sarina Ser 07/30/2011, 1:00 PM

## 2011-07-30 NOTE — [facility] Suicide Risk Assessment (Signed)
Suicide Risk Assessment  Discharge Assessment     Demographic factors:  Low socioeconomic status;Unemployed  Current Mental Status Per Nursing Assessment::   On Admission:    At Discharge: The patient is AO x 3.  She denies any significant feelings of sadness, anhedonia or depressed mood.  She also denies any auditory of visual hallucinations or delusional thinking.  She also denies any significant anxiety symptoms.  Current Mental Status Per Physician:  Diagnosis:  Axis I: Schizoaffective Disorder - Bipolar Type.  Cocaine Abuse.   The patient was seen today and reports the following:   ADL's: Intact.  Sleep: The patient reports to again sleeping well at night.  Appetite: The patient reports her appetite continues to improve.   Mild>(1-10) >Severe  Hopelessness (1-10): 0  Depression (1-10): 0  Anxiety (1-10): 0   Suicidal Ideation: The patient adamantly denies any suicidal ideations today.  Plan: No  Intent: No.  Means: No   Homicidal Ideation: The patient adamantly denies any homicidal ideations today.  Plan: No  Intent: No.  Means: No   General Appearance/Behavior: The patient remains friendly and cooperative today with this provider.  Eye Contact: Good.  Speech: Appropriate in rate and volume with no pressuring noted today.  Motor Behavior: wnl.  Level of Consciousness: Alert and Oriented x 3.  Mental Status: Alert and Oriented x 3.  Mood: Essentially Euthymic.  Affect: Bright and Full.  Anxiety Level: No anxiety reported today.  Thought Process: wnl.  Thought Content: The patient denies any auditory or visual hallucination as well as any delusional thinking today.  Perception: wnl.  Judgment: Good.  Insight: Good.  Cognition: Oriented to person, place and time.   Loss Factors: Financial problems / change in socioeconomic status  Historical Factors: Chronic Mental Illness.  Risk Reduction Factors:   Some use of cocaine.  Chronic Mental  Illness.  Continued Clinical Symptoms:  Alcohol/Substance Abuse/Dependencies More than one psychiatric diagnosis Previous Psychiatric Diagnoses and Treatments Medical Diagnoses and Treatments/Surgeries Schizoaffective Disorder - Bipolar Type.  Discharge Diagnoses:   AXIS I:   Schizoaffective Disorder - Bipolar Type.    Cocaine Abuse.    History of Alcohol Dependence - In Remission. AXIS II:   Deferred. AXIS III:   1.  Osteoarthritis.   2.  Fibromyalgia.   3.  Hypertension.   4.  Hyperlipidemia.   5.  Shortness of Breath.   6.  Angina.   7.  Asthma.  AXIS IV:   Chronic Mental Illness.  Chronic Non-psychiatric Medical Illnesses.    AXIS V:   GAF at time of admission approximately 45.  GAF at time of discharge approximately 55.  Cognitive Features That Contribute To Risk:  None Noted.    Vital Signs:Blood pressure 111/65, pulse 60, temperature 98.3 F (36.8 C), temperature source Oral, resp. rate 20, height 5\' 5"  (1.651 m), weight 75.751 kg (167 lb).   Current Medications:  Current Facility-Administered Medications   Medication  Dose  Route  Frequency  Provider  Last Rate  Last Dose   .  acetaminophen (TYLENOL) tablet 650 mg  650 mg  Oral  Q6H PRN  Curlene Labrum Justice Aguirre, MD     .  albuterol (PROVENTIL) (5 MG/ML) 0.5% nebulizer solution 2.5 mg  2.5 mg  Nebulization  PRN  Curlene Labrum Shirl Weir, MD      And   .  ipratropium (ATROVENT) nebulizer solution 0.5 mg  0.5 mg  Nebulization  PRN  Ronny Bacon, MD     .  alum & mag hydroxide-simeth (MAALOX/MYLANTA) 200-200-20 MG/5ML suspension 30 mL  30 mL  Oral  Q4H PRN  Curlene Labrum Elzia Hott, MD     .  aspirin tablet 325 mg  325 mg  Oral  Daily  Curlene Labrum Karlea Mckibbin, MD   325 mg at 07/29/11 0804   .  atenolol (TENORMIN) tablet 100 mg  100 mg  Oral  QHS  Curlene Labrum Yves Fodor, MD   100 mg at 07/28/11 2200   .  B-complex with vitamin C tablet 1 tablet  1 tablet  Oral  Daily  Ronny Bacon, MD   1 tablet at 07/29/11 0804   .  chlorhexidine (HIBICLENS) 4 %  liquid   Topical  Daily PRN  Curlene Labrum Titus Drone, MD     .  diphenhydrAMINE-zinc acetate (BENADRYL) 2-0.1 % cream   Topical  TID PRN  Wonda Cerise, MD     .  magnesium hydroxide (MILK OF MAGNESIA) suspension 30 mL  30 mL  Oral  Daily PRN  Curlene Labrum Darcey Cardy, MD     .  QUEtiapine (SEROQUEL) tablet 300 mg  300 mg  Oral  QHS  Vuong Musa D Trevaun Rendleman, MD     .  QUEtiapine (SEROQUEL) tablet 50 mg  50 mg  Oral  Q4H PRN  Mickie D. Adams, PA     .  triamcinolone 0.1 % cream : eucerin cream, 1:1   Topical  TID PRN  Mickie D. Adams, PA     .  triamterene-hydrochlorothiazide (MAXZIDE-25) 37.5-25 MG per tablet 1 each  1 each  Oral  BH-q7a  Ronny Bacon, MD   1 each at 07/29/11 [id number]   .  DISCONTD: QUEtiapine (SEROQUEL) tablet 250 mg  250 mg  Oral  QHS  Mickie D. Adams, PA   250 mg at 07/28/11 2200    Lab Results: No results found for this or any previous visit (from the past 48 hour(s)).   Review of Systems:  Neurological: The patient denies any headaches today. She denies any seizures or dizziness.  G.I.: The patient denies any constipation today or G.I. upset.  Musculoskeletal: The patient denies any musculoskeletal issues today.   Time was spent today discussing with the patient her current symptoms. The patient reports that she slept well last night and reports an improving appetite. The patient denies any significant feelings of sadness, anhedonia and depressed mood and adamantly denies any SI/HI. She denies any auditory or visual hallucinations or delusional thinking. She also denies any significant anxiety symptoms today.   Treatment Plan Summary:  1. Daily contact with patient to assess and evaluate symptoms and progress in treatment.  2. Medication management  3. The patient will deny suicidal ideations or homicidal ideations for 48 hours prior to discharge and have a depression and anxiety rating of 3 or less. The patient will also deny any auditory or visual hallucinations or delusional thinking.  4. The  patient will deny any symptoms of substance withdrawal at time of discharge.   Plan:  1. Will continue the medication Seroquel at 300 mgs po qhs today for sleep, mood stabilization and hallucinations.  2. Will continue the medication Elavil 25 mgs po qhs for sleep and chronic pain.  3. Laboratory studies reviewed.  4. Will continue to monitor. 5. Will discharge today to outpatient follow up with [facility] on August 01, 2011 at 2 pm.  Suicide Risk:  Minimal: No identifiable suicidal ideation.  Patients presenting with no risk factors but with morbid  ruminations; may be classified as minimal risk based on the severity of the depressive symptoms  Plan Of Care/Follow-up recommendations:  Activity:  As tolerated. Diet:  Heart Heathy Diet. Other:  Please take all medications only as directed and keep all scheduled follow up appoitments.  Also abstain from any use of alcohol or illicit drugs.  Memphis Creswell 07/30/2011, 11:19 AM

## 2011-07-30 NOTE — Tx Team (Signed)
Interdisciplinary Treatment Plan Update (Adult)  Date:  07/30/2011  Time Reviewed:  10:15AM-11:15AM  Progress in Treatment: Attending groups:  Yes Participating in groups:    Yes Taking medication as prescribed:    Yes Tolerating medication:   Yes Family/Significant other contact made:  Will be done prior to patient leaving Patient understands diagnosis:  Yes  Discussing patient identified problems/goals with staff:   Yes Medical problems stabilized or resolved:   Yes Denies suicidal/homicidal ideation:  Yes Issues/concerns per patient self-inventory:   None Other:    New problem(s) identified: No, Describe:    Reason for Continuation of Hospitalization: None  Interventions implemented related to continuation of hospitalization:  Medication monitoring and adjustment, safety checks Q15 min., suicide risk assessment, group therapy, psychoeducation, collateral contact, aftercare planning, ongoing physician assessments, medication education - UNTIL DISCHARGE  Additional comments:  Not applicable  Estimated length of stay:  Discharge today  Discharge Plan:  Return to [place] and return to [facility]  Go to her home where she lives alone.  New goal(s):  Not applicable  Review of initial/current patient goals per problem list:   1.  Goal(s):  Deny SI / HI for 48 hours prior to D/C.  Met:  Yes  Target date:  By Discharge   As evidenced by:  "Resolved" yesterday  2.  Goal(s):  Return sleep to normal pattern of 6+ hours nightly.  Met:  Yes  Target date:  By Discharge   As evidenced by:  Says is sleeping well, slept 6.25   3.  Goal(s):  Reduce auditory hallucinations (with commands) to baseline.  Met:  Yes  Target date:  By Discharge   As evidenced by:  Denies all AH  4.  Goal(s):  Medication stabilization  Met:  Yes  Target date:  By Discharge   As evidenced by:  Is stable for discharge  5. Goal(s): Determine if & how to address any substance abuse  issues.  Met: Yes Target date: By Discharge  As evidenced by:  Is in adequate services with people who know her well  Attendees: Patient:  Wanda Brown  07/30/2011 10:15AM-11:15AM  Family:     Physician:  Dr. Harvie Heck Readling 07/30/2011 10:15AM-11:15AM  Nursing:   Izola Price, RN 07/30/2011 10:15AM -11:15AM   Case Manager:  Ambrose Mantle, LCSW 07/30/2011 10:15AM-11:15AM  Counselor:  Veto Kemps, MT-BC 07/30/2011 10:15AM-11:15AM  Other:   Waynetta Sandy, RN 07/30/2011 10:15AM-11:15AM  Other:      Other:      Other:       Scribe for Treatment Team:   Sarina Ser, 07/30/2011, 10:15AM-11:15AM

## 2011-07-30 NOTE — Progress Notes (Signed)
Pt d/c at 1340 per MD orders. Discharge instructions and prescriptions reviewed with pt. Pt denied SI/HI.

## 2011-08-01 NOTE — Progress Notes (Signed)
Patient Discharge Instructions:  After Visit Summary (AVS):   Faxed to:  08/01/2011 Psychiatric Admission Assessment Note:   Faxed to:  08/01/2011 Suicide Risk Assessment - Discharge Assessment:   Faxed to:  08/01/2011 Faxed/Sent to the Next Level Care provider:  08/01/2011  Faxed to [facility] @ [phone] And to [facility] @ [phone]  Wandra Scot, 08/01/2011, 6:14 PM

## 2011-08-04 NOTE — Discharge Summary (Signed)
Physician Discharge Summary Note  Patient:  Wanda Brown is an 61 y.o., female MRN:  [id number] DOB:  06/12/1950 Patient phone:  [phone] (home)  Patient address:   [address] [place] [address],   Date of Admission:  07/27/2011 Date of Discharge: 07/29/2101  Reason for Admission:  The patient was admitted for evaluation and treatment of depression with suicidal and homicidal ideations as well as command auditory hallucinations.  Discharge Diagnoses: Principal Problem:  *Schizoaffective disorder, bipolar type Active Problems:  Cocaine abuse  Demographic factors:  Low socioeconomic status;Unemployed   Current Mental Status Per Nursing Assessment::  On Admission:  At Discharge: The patient is AO x 3. She denies any significant feelings of sadness, anhedonia or depressed mood. She also denies any auditory of visual hallucinations or delusional thinking. She also denies any significant anxiety symptoms.   Current Mental Status Per Physician:  Diagnosis:  Axis I: Schizoaffective Disorder - Bipolar Type.  Cocaine Abuse.   The patient was seen today and reports the following:   ADL's: Intact.  Sleep: The patient reports to again sleeping well at night.  Appetite: The patient reports her appetite continues to improve.   Mild>(1-10) >Severe  Hopelessness (1-10): 0  Depression (1-10): 0  Anxiety (1-10): 0   Suicidal Ideation: The patient adamantly denies any suicidal ideations today.  Plan: No  Intent: No.  Means: No  Homicidal Ideation: The patient adamantly denies any homicidal ideations today.  Plan: No  Intent: No.  Means: No   General Appearance/Behavior: The patient remains friendly and cooperative today with this provider.  Eye Contact: Good.  Speech: Appropriate in rate and volume with no pressuring noted today.  Motor Behavior: wnl.  Level of Consciousness: Alert and Oriented x 3.  Mental Status: Alert and Oriented x 3.  Mood: Essentially Euthymic.   Affect: Bright and Full.  Anxiety Level: No anxiety reported today.  Thought Process: wnl.  Thought Content: The patient denies any auditory or visual hallucination as well as any delusional thinking today.  Perception: wnl.  Judgment: Good.  Insight: Good.  Cognition: Oriented to person, place and time.   Loss Factors:  Financial problems / change in socioeconomic status   Historical Factors:  Chronic Mental Illness.   Risk Reduction Factors:  Some use of cocaine. Chronic Mental Illness.   Continued Clinical Symptoms:  Alcohol/Substance Abuse/Dependencies  More than one psychiatric diagnosis  Previous Psychiatric Diagnoses and Treatments  Medical Diagnoses and Treatments/Surgeries  Schizoaffective Disorder - Bipolar Type.   Discharge Diagnoses:  AXIS I: Schizoaffective Disorder - Bipolar Type.  Cocaine Abuse.  History of Alcohol Dependence - In Remission.  AXIS II: Deferred.  AXIS III: 1. Osteoarthritis.  2. Fibromyalgia.  3. Hypertension.  4. Hyperlipidemia.  5. Shortness of Breath.  6. Angina.  7. Asthma.  AXIS IV: Chronic Mental Illness. Chronic Non-psychiatric Medical Illnesses.  AXIS V: GAF at time of admission approximately 45. GAF at time of discharge approximately 55.   Cognitive Features That Contribute To Risk:  None Noted.   Vital Signs:Blood pressure 111/65, pulse 60, temperature 98.3 F (36.8 C), temperature source Oral, resp. rate 20, height 5\' 5"  (1.651 m), weight 75.751 kg (167 lb).   Current Medications:  Current Facility-Administered Medications   Medication  Dose  Route  Frequency  Provider  Last Rate  Last Dose   .  acetaminophen (TYLENOL) tablet 650 mg  650 mg  Oral  Q6H PRN  Ronny Bacon, MD     .  albuterol (PROVENTIL) (5 MG/ML) 0.5% nebulizer solution 2.5 mg  2.5 mg  Nebulization  PRN  Curlene Labrum Serenah Mill, MD      And   .  ipratropium (ATROVENT) nebulizer solution 0.5 mg  0.5 mg  Nebulization  PRN  Curlene Labrum Vester Balthazor, MD     .  alum & mag  hydroxide-simeth (MAALOX/MYLANTA) 200-200-20 MG/5ML suspension 30 mL  30 mL  Oral  Q4H PRN  Curlene Labrum Cynthia Stainback, MD     .  aspirin tablet 325 mg  325 mg  Oral  Daily  Curlene Labrum Freddye Cardamone, MD   325 mg at 07/29/11 0804   .  atenolol (TENORMIN) tablet 100 mg  100 mg  Oral  QHS  Curlene Labrum Izael Bessinger, MD   100 mg at 07/28/11 2200   .  B-complex with vitamin C tablet 1 tablet  1 tablet  Oral  Daily  Ronny Bacon, MD   1 tablet at 07/29/11 0804   .  chlorhexidine (HIBICLENS) 4 % liquid   Topical  Daily PRN  Curlene Labrum Kearstyn Avitia, MD     .  diphenhydrAMINE-zinc acetate (BENADRYL) 2-0.1 % cream   Topical  TID PRN  Wonda Cerise, MD     .  magnesium hydroxide (MILK OF MAGNESIA) suspension 30 mL  30 mL  Oral  Daily PRN  Curlene Labrum Jonas Goh, MD     .  QUEtiapine (SEROQUEL) tablet 300 mg  300 mg  Oral  QHS  Kwali Wrinkle D Dozier Berkovich, MD     .  QUEtiapine (SEROQUEL) tablet 50 mg  50 mg  Oral  Q4H PRN  Mickie D. Adams, PA     .  triamcinolone 0.1 % cream : eucerin cream, 1:1   Topical  TID PRN  Mickie D. Adams, PA     .  triamterene-hydrochlorothiazide (MAXZIDE-25) 37.5-25 MG per tablet 1 each  1 each  Oral  BH-q7a  Ronny Bacon, MD   1 each at 07/29/11 [phone]   .  DISCONTD: QUEtiapine (SEROQUEL) tablet 250 mg  250 mg  Oral  QHS  Mickie D. Adams, PA   250 mg at 07/28/11 2200    Lab Results: No results found for this or any previous visit (from the past 48 hour(s)).   Review of Systems:  Neurological: The patient denies any headaches today. She denies any seizures or dizziness.  G.I.: The patient denies any constipation today or G.I. upset.  Musculoskeletal: The patient denies any musculoskeletal issues today.   Time was spent today discussing with the patient her current symptoms. The patient reports that she slept well last night and reports an improving appetite. The patient denies any significant feelings of sadness, anhedonia and depressed mood and adamantly denies any SI/HI. She denies any auditory or visual hallucinations or  delusional thinking. She also denies any significant anxiety symptoms today.   Treatment Plan Summary:  1. Daily contact with patient to assess and evaluate symptoms and progress in treatment.  2. Medication management  3. The patient will deny suicidal ideations or homicidal ideations for 48 hours prior to discharge and have a depression and anxiety rating of 3 or less. The patient will also deny any auditory or visual hallucinations or delusional thinking.  4. The patient will deny any symptoms of substance withdrawal at time of discharge.   Plan:  1. Will continue the medication Seroquel at 300 mgs po qhs today for sleep, mood stabilization and hallucinations.  2. Will continue the medication Elavil 25  mgs po qhs for sleep and chronic pain.  3. Laboratory studies reviewed.  4. Will continue to monitor.  5. Will discharge today to outpatient follow up with [facility] on August 01, 2011 at 2 pm.   Suicide Risk:  Minimal: No identifiable suicidal ideation. Patients presenting with no risk factors but with morbid ruminations; may be classified as minimal risk based on the severity of the depressive symptoms   Plan Of Care/Follow-up recommendations:  Activity: As tolerated.  Diet: Heart Heathy Diet.  Other: Please take all medications only as directed and keep all scheduled follow up appointments. Also abstain from any use of alcohol or illicit drugs.  Level of Care:  OP  Hospital Course:  The patient was admitted for evaluation and treatment of depression with suicidal and homicidal ideations as well as command auditory hallucinations.  She responded well to the medications prescribed as well as to groups and milieu therapy.  On day of discharge the patient was AO x 3.  She denied any significant feelings of sadness, anhedonia or depressed mood. She also denied any auditory of visual hallucinations or delusional thinking. She also denied any significant anxiety symptoms.   She was discharged to  outpatient treatment.  Consults:  None.  Significant Diagnostic Studies:  See electronic medical record.  Discharge Vitals:   Blood pressure 117/71, pulse 56, temperature 97.3 F (36.3 C), temperature source Oral, resp. rate 20, height 5\' 5"  (1.651 m), weight 75.751 kg (167 lb).  Mental Status Exam: See Mental Status Examination and Suicide Risk Assessment completed by Attending Physician prior to discharge.  Discharge destination:  Home  Is patient on multiple antipsychotic therapies at discharge:  No   Has Patient had three or more failed trials of antipsychotic monotherapy by history:  N/A  Recommended Plan for Multiple Antipsychotic Therapies: N/A  Discharge Orders    Future Orders Please Complete By Expires   Diet - low sodium heart healthy      Increase activity slowly      Discharge instructions      Comments:   Please take all medications only as directed and keep all scheduled follow up appointments.     Medication List  As of 08/04/2011  6:21 PM   STOP taking these medications         ALPRAZolam 0.5 MG tablet      azithromycin 250 MG tablet      hydrOXYzine 25 MG tablet      hydrOXYzine 50 MG tablet      oxyCODONE 20 MG 12 hr tablet      potassium chloride 20 MEQ packet         TAKE these medications      Indication    aspirin 325 MG tablet   Take 1 tablet (325 mg total) by mouth daily. Blood thinner for heart health.       atenolol 100 MG tablet   Commonly known as: TENORMIN   Take 1 tablet (100 mg total) by mouth at bedtime. For high blood pressure       b complex vitamins tablet   Take 1 tablet by mouth daily. For B-vitamin supplement       ipratropium-albuterol 0.5-2.5 (3) MG/3ML Soln   Commonly known as: DUONEB   Take 3 mLs by nebulization as needed. For wheezing/SOB       multivitamin capsule   Take 1 capsule by mouth daily. For multivitamin supplement       QUEtiapine 300 MG tablet  Commonly known as: SEROQUEL   Take 1 tablet (300 mg  total) by mouth at bedtime. For mood control       triamterene-hydrochlorothiazide 37.5-25 MG per capsule   Commonly known as: DYAZIDE   Take 1 each (1 capsule total) by mouth every morning. For high blood pressure            Follow-up Information    Follow up with Clerance Lav on 08/01/2011. (2:00PM appointment)    Contact information:   [facility]  [address] [place]  [address] Telephone:  [phone]      Follow up with [facility] on 08/01/2011. (9:00AM Return to [facility] program )    Contact information:   [facility] [address] [place]  [address] Telephone:  [phone]        Follow-up recommendations:   Activity: As tolerated.  Diet: Heart Heathy Diet.  Other: Please take all medications only as directed and keep all scheduled follow up appointments. Also abstain from any use of alcohol or illicit drugs.  Comments:  The patient was discharge to outpatient follow up with [facility] on August 01, 2011 at 2 pm.  Signed: Franchot Gallo 08/04/2011, 6:21 PM

## 2011-12-02 ENCOUNTER — Encounter (HOSPITAL_COMMUNITY): Payer: Self-pay | Admitting: *Deleted

## 2011-12-02 ENCOUNTER — Emergency Department (HOSPITAL_COMMUNITY)
Admission: EM | Admit: 2011-12-02 | Discharge: 2011-12-03 | Disposition: A | Discharge status: Home / Self Care | Payer: PRIVATE HEALTH INSURANCE | Attending: Emergency Medicine | Admitting: Emergency Medicine

## 2011-12-02 DIAGNOSIS — M549 Dorsalgia, unspecified: Secondary | ICD-10-CM | POA: Insufficient documentation
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
## 2011-12-02 NOTE — ED Notes (Signed)
Pt c/o back pain after lifting her large tv.

## 2011-12-03 ENCOUNTER — Emergency Department (HOSPITAL_COMMUNITY): Payer: PRIVATE HEALTH INSURANCE

## 2011-12-03 MED ORDER — OXYCODONE-ACETAMINOPHEN 5-325 MG PO TABS
2.0000 | ORAL_TABLET | Freq: Once | ORAL | Status: AC
  Administered 2011-12-03: 2 via ORAL
  Filled 2011-12-03: qty 2

## 2011-12-03 MED ORDER — OXYCODONE-ACETAMINOPHEN 10-325 MG PO TABS: 1.0000 | ORAL_TABLET | ORAL | Status: DC | PRN

## 2011-12-03 NOTE — ED Provider Notes (Signed)
Medical screening examination/treatment/procedure(s) were performed by non-physician practitioner and as supervising physician I was immediately available for consultation/collaboration.   Richardean Canal, MD 12/03/11 [phone]

## 2011-12-03 NOTE — ED Provider Notes (Signed)
Yecheskel Kurek S 1:00 AM patient discussed in sign out with Dr. [name]. Patient has history of chronic pains and is on chronic pain medication for chronic knee pain. This evening patient was lifting a TV and twisted oddly causing back pain. Patient has been ambulatory and did come by the bus today. Given patient's age she is going for plain film x-rays to rule out compression fracture or other concerning cause of symptoms. Pain medication has been ordered.   1:30AM X-rays unremarkable without signs of compression fracture. Patient has good followup with PCP who manages her chronic pain. At this time we'll discharge home with small prescription of Percocet 10 mg for her acute pain.  Angus Seller, [place] 12/03/11 [phone]

## 2012-04-03 ENCOUNTER — Other Ambulatory Visit (HOSPITAL_COMMUNITY): Payer: Self-pay | Admitting: Internal Medicine

## 2012-04-03 DIAGNOSIS — I729 Aneurysm of unspecified site: Secondary | ICD-10-CM

## 2012-04-07 ENCOUNTER — Ambulatory Visit (HOSPITAL_COMMUNITY)
Admission: RE | Admit: 2012-04-07 | Discharge: 2012-04-07 | Disposition: A | Discharge status: Home / Self Care | Payer: PRIVATE HEALTH INSURANCE | Source: Ambulatory Visit | Attending: Internal Medicine | Admitting: Internal Medicine

## 2012-04-07 DIAGNOSIS — I729 Aneurysm of unspecified site: Secondary | ICD-10-CM

## 2012-04-07 NOTE — Procedures (Deleted)
S/P 4 vessel cerebral  Arteriogram  Rt CFA approach. Preliminary findings  1.55 to 60 % stenosis of LT ICA cavernous seg,and origin og Lt [place]  2. 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

## 2012-09-24 ENCOUNTER — Telehealth: Payer: Self-pay | Admitting: Internal Medicine

## 2012-09-24 NOTE — Telephone Encounter (Signed)
Left message for pt to call back  °

## 2012-09-24 NOTE — Telephone Encounter (Signed)
Pt scheduled to see Amy Esterwood PA 09/28/12@11am . Sheri to notify pt of appt date and time.

## 2012-09-28 ENCOUNTER — Ambulatory Visit: Payer: Self-pay | Admitting: Physician Assistant

## 2012-12-22 ENCOUNTER — Other Ambulatory Visit (HOSPITAL_COMMUNITY): Payer: Self-pay | Admitting: Interventional Radiology

## 2012-12-22 ENCOUNTER — Telehealth (HOSPITAL_COMMUNITY): Payer: Self-pay | Admitting: Interventional Radiology

## 2012-12-22 DIAGNOSIS — R55 Syncope and collapse: Secondary | ICD-10-CM

## 2012-12-22 DIAGNOSIS — R519 Headache, unspecified: Secondary | ICD-10-CM

## 2012-12-22 DIAGNOSIS — R42 Dizziness and giddiness: Secondary | ICD-10-CM

## 2012-12-22 DIAGNOSIS — H538 Other visual disturbances: Secondary | ICD-10-CM

## 2012-12-22 DIAGNOSIS — I729 Aneurysm of unspecified site: Secondary | ICD-10-CM

## 2012-12-22 NOTE — Telephone Encounter (Signed)
Called pt left VM for her to call to schedule angio JMichaux

## 2012-12-23 ENCOUNTER — Other Ambulatory Visit: Payer: Self-pay | Admitting: Radiology

## 2012-12-23 ENCOUNTER — Telehealth (HOSPITAL_COMMUNITY): Payer: Self-pay | Admitting: Interventional Radiology

## 2012-12-23 NOTE — Telephone Encounter (Signed)
Called pt left VM for her to call and schedule appt JM

## 2012-12-24 ENCOUNTER — Other Ambulatory Visit: Payer: Self-pay | Admitting: Radiology

## 2012-12-25 ENCOUNTER — Other Ambulatory Visit (HOSPITAL_COMMUNITY): Payer: Self-pay | Admitting: Interventional Radiology

## 2012-12-25 ENCOUNTER — Ambulatory Visit (HOSPITAL_COMMUNITY)
Admission: RE | Admit: 2012-12-25 | Discharge: 2012-12-25 | Disposition: A | Discharge status: Home / Self Care | Payer: PRIVATE HEALTH INSURANCE | Source: Ambulatory Visit | Attending: Interventional Radiology | Admitting: Interventional Radiology

## 2012-12-25 ENCOUNTER — Encounter (HOSPITAL_COMMUNITY): Payer: Self-pay

## 2012-12-25 DIAGNOSIS — R55 Syncope and collapse: Secondary | ICD-10-CM

## 2012-12-25 DIAGNOSIS — I671 Cerebral aneurysm, nonruptured: Secondary | ICD-10-CM | POA: Insufficient documentation

## 2012-12-25 DIAGNOSIS — R42 Dizziness and giddiness: Secondary | ICD-10-CM

## 2012-12-25 DIAGNOSIS — IMO0001 Reserved for inherently not codable concepts without codable children: Secondary | ICD-10-CM | POA: Insufficient documentation

## 2012-12-25 DIAGNOSIS — E785 Hyperlipidemia, unspecified: Secondary | ICD-10-CM | POA: Insufficient documentation

## 2012-12-25 DIAGNOSIS — F3289 Other specified depressive episodes: Secondary | ICD-10-CM | POA: Insufficient documentation

## 2012-12-25 DIAGNOSIS — F411 Generalized anxiety disorder: Secondary | ICD-10-CM | POA: Insufficient documentation

## 2012-12-25 DIAGNOSIS — F1020 Alcohol dependence, uncomplicated: Secondary | ICD-10-CM | POA: Insufficient documentation

## 2012-12-25 DIAGNOSIS — I10 Essential (primary) hypertension: Secondary | ICD-10-CM | POA: Insufficient documentation

## 2012-12-25 DIAGNOSIS — F329 Major depressive disorder, single episode, unspecified: Secondary | ICD-10-CM | POA: Insufficient documentation

## 2012-12-25 DIAGNOSIS — I729 Aneurysm of unspecified site: Secondary | ICD-10-CM

## 2012-12-25 DIAGNOSIS — H538 Other visual disturbances: Secondary | ICD-10-CM

## 2012-12-25 DIAGNOSIS — I6509 Occlusion and stenosis of unspecified vertebral artery: Secondary | ICD-10-CM | POA: Insufficient documentation

## 2012-12-25 LAB — BASIC METABOLIC PANEL
BUN: 11 mg/dL (ref 6–23)
Creatinine, Ser: 0.83 mg/dL (ref 0.50–1.10)
GFR calc Af Amer: 85 mL/min — ABNORMAL LOW (ref >90)
GFR calc non Af Amer: 73 mL/min — ABNORMAL LOW (ref >90)

## 2012-12-25 LAB — CBC WITH DIFFERENTIAL/PLATELET
Basophils Absolute: 0.1 10*3/uL (ref 0.0–0.1)
Basophils Relative: 1 % (ref 0–1)
Lymphs Abs: 3.9 10*3/uL (ref 0.7–4.0)
MCHC: 34.6 g/dL (ref 30.0–36.0)
MCV: 88.8 fL (ref 78.0–100.0)
Neutro Abs: 8.2 10*3/uL — ABNORMAL HIGH (ref 1.7–7.7)
Neutrophils Relative %: 61 % (ref 43–77)
Platelets: 313 10*3/uL (ref 150–400)
RBC: 4.27 MIL/uL (ref 3.87–5.11)
RDW: 13.4 % (ref 11.5–15.5)

## 2012-12-25 LAB — URINALYSIS, ROUTINE W REFLEX MICROSCOPIC
Glucose, UA: NEGATIVE mg/dL
Ketones, ur: NEGATIVE mg/dL
Leukocytes, UA: NEGATIVE
Nitrite: NEGATIVE
Protein, ur: NEGATIVE mg/dL
Urobilinogen, UA: 0.2 mg/dL (ref 0.0–1.0)

## 2012-12-25 LAB — PROTIME-INR: INR: 0.99 (ref 0.00–1.49)

## 2012-12-25 MED ORDER — MIDAZOLAM HCL 2 MG/2ML IJ SOLN
INTRAMUSCULAR | Status: AC | PRN
  Administered 2012-12-25: 1 mg via INTRAVENOUS

## 2012-12-25 MED ORDER — SODIUM CHLORIDE 0.9 % IV SOLN
Freq: Once | INTRAVENOUS | Status: AC
  Administered 2012-12-25: 1000 mL via INTRAVENOUS

## 2012-12-25 MED ORDER — MIDAZOLAM HCL 2 MG/2ML IJ SOLN
INTRAMUSCULAR | Status: AC
  Filled 2012-12-25: qty 2

## 2012-12-25 MED ORDER — IOHEXOL 300 MG/ML  SOLN
150.0000 mL | Freq: Once | INTRAMUSCULAR | Status: AC | PRN
  Administered 2012-12-25: 70 mL via INTRAVENOUS

## 2012-12-25 MED ORDER — CEFAZOLIN SODIUM 1-5 GM-% IV SOLN
1.0000 g | INTRAVENOUS | Status: AC
  Administered 2012-12-25: 1 g via INTRAVENOUS
  Filled 2012-12-25: qty 50

## 2012-12-25 MED ORDER — HEPARIN SOD (PORK) LOCK FLUSH 100 UNIT/ML IV SOLN
INTRAVENOUS | Status: AC | PRN
  Administered 2012-12-25 (×2): 500 [IU] via INTRAVENOUS

## 2012-12-25 MED ORDER — FENTANYL CITRATE 0.05 MG/ML IJ SOLN
INTRAMUSCULAR | Status: AC | PRN
  Administered 2012-12-25: 25 ug via INTRAVENOUS

## 2012-12-25 MED ORDER — SODIUM CHLORIDE 0.9 % IV SOLN: INTRAVENOUS | Status: AC

## 2012-12-25 MED ORDER — HYDRALAZINE HCL 20 MG/ML IJ SOLN
INTRAMUSCULAR | Status: AC
  Filled 2012-12-25: qty 1

## 2012-12-25 MED ORDER — FENTANYL CITRATE 0.05 MG/ML IJ SOLN
INTRAMUSCULAR | Status: AC
  Filled 2012-12-25: qty 2

## 2012-12-25 NOTE — Procedures (Signed)
S/P 4 vessel cerebral arteriogram. Rt CFA approach. Findings. 1.Obliterated Lt ICA supraclinoid aneurysm.Wide patency gf the stent across it . 2.Appro 70-75% % stenosis of LT VA origin. 3. Small  DAVF Lt occipital reg fed by small dural branch
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
## 2013-01-22 ENCOUNTER — Emergency Department (HOSPITAL_COMMUNITY): Payer: PRIVATE HEALTH INSURANCE

## 2013-01-22 ENCOUNTER — Emergency Department (HOSPITAL_COMMUNITY)
Admission: EM | Admit: 2013-01-22 | Discharge: 2013-01-22 | Disposition: A | Discharge status: Home / Self Care | Payer: PRIVATE HEALTH INSURANCE | Attending: Emergency Medicine | Admitting: Emergency Medicine

## 2013-01-22 ENCOUNTER — Encounter (HOSPITAL_COMMUNITY): Payer: Self-pay | Admitting: Emergency Medicine

## 2013-01-22 DIAGNOSIS — I10 Essential (primary) hypertension: Secondary | ICD-10-CM | POA: Insufficient documentation

## 2013-01-22 DIAGNOSIS — M542 Cervicalgia: Secondary | ICD-10-CM | POA: Insufficient documentation

## 2013-01-22 DIAGNOSIS — F319 Bipolar disorder, unspecified: Secondary | ICD-10-CM | POA: Insufficient documentation

## 2013-01-22 DIAGNOSIS — I209 Angina pectoris, unspecified: Secondary | ICD-10-CM | POA: Insufficient documentation

## 2013-01-22 DIAGNOSIS — Z79899 Other long term (current) drug therapy: Secondary | ICD-10-CM | POA: Insufficient documentation

## 2013-01-22 DIAGNOSIS — F17200 Nicotine dependence, unspecified, uncomplicated: Secondary | ICD-10-CM | POA: Insufficient documentation

## 2013-01-22 DIAGNOSIS — Z8639 Personal history of other endocrine, nutritional and metabolic disease: Secondary | ICD-10-CM | POA: Insufficient documentation

## 2013-01-22 DIAGNOSIS — Z7982 Long term (current) use of aspirin: Secondary | ICD-10-CM | POA: Insufficient documentation

## 2013-01-22 DIAGNOSIS — R071 Chest pain on breathing: Secondary | ICD-10-CM | POA: Insufficient documentation

## 2013-01-22 DIAGNOSIS — M129 Arthropathy, unspecified: Secondary | ICD-10-CM | POA: Diagnosis not present

## 2013-01-22 DIAGNOSIS — M25559 Pain in unspecified hip: Secondary | ICD-10-CM | POA: Diagnosis not present

## 2013-01-22 DIAGNOSIS — M79609 Pain in unspecified limb: Secondary | ICD-10-CM | POA: Diagnosis present

## 2013-01-22 DIAGNOSIS — J45909 Unspecified asthma, uncomplicated: Secondary | ICD-10-CM | POA: Insufficient documentation

## 2013-01-22 DIAGNOSIS — F411 Generalized anxiety disorder: Secondary | ICD-10-CM | POA: Diagnosis not present

## 2013-01-22 DIAGNOSIS — Z862 Personal history of diseases of the blood and blood-forming organs and certain disorders involving the immune mechanism: Secondary | ICD-10-CM | POA: Insufficient documentation

## 2013-01-22 DIAGNOSIS — R0789 Other chest pain: Secondary | ICD-10-CM

## 2013-01-22 LAB — CBC
HCT: 41.7 % (ref 36.0–46.0)
RBC: 4.7 MIL/uL (ref 3.87–5.11)
RDW: 13.8 % (ref 11.5–15.5)
WBC: 9 10*3/uL (ref 4.0–10.5)

## 2013-01-22 LAB — BASIC METABOLIC PANEL
BUN: 14 mg/dL (ref 6–23)
CO2: 26 mEq/L (ref 19–32)
Chloride: 102 mEq/L (ref 96–112)
Creatinine, Ser: 1.01 mg/dL (ref 0.50–1.10)
GFR calc Af Amer: 67 mL/min — ABNORMAL LOW (ref >90)
GFR calc non Af Amer: 58 mL/min — ABNORMAL LOW (ref >90)
Potassium: 3.9 mEq/L (ref 3.5–5.1)

## 2013-01-22 LAB — POCT I-STAT TROPONIN I: Troponin i, poc: 0 ng/mL (ref 0.00–0.08)

## 2013-01-22 MED ORDER — KETOROLAC TROMETHAMINE 30 MG/ML IJ SOLN
30.0000 mg | Freq: Once | INTRAMUSCULAR | Status: AC
  Administered 2013-01-22: 30 mg via INTRAVENOUS
  Filled 2013-01-22: qty 1

## 2013-01-22 NOTE — ED Notes (Signed)
Pt c/o Left arm pain radiating under her Left shoulder blade and Left lateral neck and nausea x4 days with increase pain yesterday. Pt denies any injury.
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
## 2013-01-27 ENCOUNTER — Other Ambulatory Visit (HOSPITAL_COMMUNITY): Payer: Self-pay | Admitting: *Deleted

## 2013-01-27 ENCOUNTER — Other Ambulatory Visit (HOSPITAL_COMMUNITY): Payer: Self-pay | Admitting: Internal Medicine

## 2013-01-27 DIAGNOSIS — IMO0002 Reserved for concepts with insufficient information to code with codable children: Secondary | ICD-10-CM

## 2013-01-27 DIAGNOSIS — G459 Transient cerebral ischemic attack, unspecified: Secondary | ICD-10-CM

## 2013-02-01 ENCOUNTER — Ambulatory Visit (HOSPITAL_COMMUNITY)
Admission: RE | Admit: 2013-02-01 | Discharge: 2013-02-01 | Disposition: A | Discharge status: Home / Self Care | Payer: PRIVATE HEALTH INSURANCE | Source: Ambulatory Visit | Attending: Internal Medicine | Admitting: Internal Medicine

## 2013-02-01 ENCOUNTER — Other Ambulatory Visit (HOSPITAL_COMMUNITY): Payer: Self-pay | Admitting: Internal Medicine

## 2013-02-01 DIAGNOSIS — IMO0002 Reserved for concepts with insufficient information to code with codable children: Secondary | ICD-10-CM

## 2013-02-01 DIAGNOSIS — I658 Occlusion and stenosis of other precerebral arteries: Secondary | ICD-10-CM | POA: Insufficient documentation

## 2013-02-01 DIAGNOSIS — G459 Transient cerebral ischemic attack, unspecified: Secondary | ICD-10-CM

## 2013-02-01 DIAGNOSIS — F17200 Nicotine dependence, unspecified, uncomplicated: Secondary | ICD-10-CM | POA: Insufficient documentation

## 2013-02-01 DIAGNOSIS — M542 Cervicalgia: Secondary | ICD-10-CM

## 2013-02-01 DIAGNOSIS — M47812 Spondylosis without myelopathy or radiculopathy, cervical region: Secondary | ICD-10-CM | POA: Insufficient documentation

## 2013-02-01 DIAGNOSIS — I10 Essential (primary) hypertension: Secondary | ICD-10-CM | POA: Insufficient documentation

## 2013-02-01 DIAGNOSIS — I6529 Occlusion and stenosis of unspecified carotid artery: Secondary | ICD-10-CM | POA: Insufficient documentation

## 2013-02-01 NOTE — Progress Notes (Signed)
VASCULAR LAB PRELIMINARY  PRELIMINARY  PRELIMINARY  PRELIMINARY   Carotid duplex completed.    Preliminary report:  Bilateral:  1-39% ICA stenosis.  Vertebral artery flow is antegrade.     Lynore Coscia, RVS 02/01/2013, 1:04 PM

## 2013-03-03 ENCOUNTER — Encounter: Payer: Self-pay | Admitting: Internal Medicine

## 2013-04-03 ENCOUNTER — Encounter: Payer: Self-pay | Admitting: *Deleted

## 2013-06-07 ENCOUNTER — Other Ambulatory Visit (HOSPITAL_COMMUNITY): Payer: Self-pay | Admitting: Interventional Radiology

## 2013-06-07 ENCOUNTER — Telehealth (HOSPITAL_COMMUNITY): Payer: Self-pay | Admitting: Interventional Radiology

## 2013-06-07 DIAGNOSIS — I729 Aneurysm of unspecified site: Secondary | ICD-10-CM

## 2013-06-07 NOTE — Telephone Encounter (Signed)
Called pt, left VM to call me to schedule f/u MRI/MRA JM

## 2013-06-22 ENCOUNTER — Telehealth (HOSPITAL_COMMUNITY): Payer: Self-pay | Admitting: Interventional Radiology

## 2013-06-22 NOTE — Telephone Encounter (Signed)
Called pt, left VM to call and schedule f/u visit. JMichaux

## 2013-09-08 ENCOUNTER — Encounter: Payer: Self-pay | Admitting: Internal Medicine

## 2014-12-28 ENCOUNTER — Encounter: Payer: Self-pay | Admitting: Internal Medicine
